# Patient Record
Sex: Female | Born: 1965
Health system: Southern US, Community
[De-identification: ages and names within clinical notes are randomized; demographics above are authoritative.]

## PROBLEM LIST (undated history)

## (undated) DIAGNOSIS — E7800 Pure hypercholesterolemia, unspecified: Secondary | ICD-10-CM

## (undated) DIAGNOSIS — G43909 Migraine, unspecified, not intractable, without status migrainosus: Secondary | ICD-10-CM

## (undated) DIAGNOSIS — N879 Dysplasia of cervix uteri, unspecified: Secondary | ICD-10-CM

## (undated) DIAGNOSIS — O43219 Placenta accreta, unspecified trimester: Secondary | ICD-10-CM

## (undated) DIAGNOSIS — R87619 Unspecified abnormal cytological findings in specimens from cervix uteri: Secondary | ICD-10-CM

## (undated) HISTORY — DX: Dysplasia of cervix uteri, unspecified: N87.9

## (undated) HISTORY — DX: Pure hypercholesterolemia, unspecified: E78.00

## (undated) HISTORY — PX: DILATION AND CURETTAGE OF UTERUS: SHX78

## (undated) HISTORY — DX: Placenta accreta, unspecified trimester: O43.219

## (undated) HISTORY — DX: Migraine, unspecified, not intractable, without status migrainosus: G43.909

## (undated) HISTORY — DX: Unspecified abnormal cytological findings in specimens from cervix uteri: R87.619

---

## 1994-09-14 DIAGNOSIS — R87619 Unspecified abnormal cytological findings in specimens from cervix uteri: Secondary | ICD-10-CM

## 1994-09-14 DIAGNOSIS — N879 Dysplasia of cervix uteri, unspecified: Secondary | ICD-10-CM

## 1994-09-14 HISTORY — DX: Unspecified abnormal cytological findings in specimens from cervix uteri: R87.619

## 1994-09-14 HISTORY — DX: Dysplasia of cervix uteri, unspecified: N87.9

## 1994-09-14 HISTORY — PX: CRYOTHERAPY: SHX1416

## 1998-03-25 ENCOUNTER — Other Ambulatory Visit: Admission: RE | Admit: 1998-03-25 | Discharge: 1998-03-25 | Payer: Self-pay | Admitting: Gynecology

## 1999-07-03 ENCOUNTER — Other Ambulatory Visit: Admission: RE | Admit: 1999-07-03 | Discharge: 1999-07-03 | Payer: Self-pay | Admitting: Gynecology

## 2000-06-10 ENCOUNTER — Encounter (INDEPENDENT_AMBULATORY_CARE_PROVIDER_SITE_OTHER): Payer: Self-pay | Admitting: Specialist

## 2000-06-10 ENCOUNTER — Ambulatory Visit (HOSPITAL_COMMUNITY): Admission: RE | Admit: 2000-06-10 | Discharge: 2000-06-10 | Payer: Self-pay | Admitting: Obstetrics and Gynecology

## 2000-06-25 ENCOUNTER — Other Ambulatory Visit: Admission: RE | Admit: 2000-06-25 | Discharge: 2000-06-25 | Payer: Self-pay | Admitting: Obstetrics and Gynecology

## 2000-12-03 ENCOUNTER — Encounter (INDEPENDENT_AMBULATORY_CARE_PROVIDER_SITE_OTHER): Payer: Self-pay | Admitting: *Deleted

## 2000-12-03 ENCOUNTER — Ambulatory Visit (HOSPITAL_COMMUNITY): Admission: RE | Admit: 2000-12-03 | Discharge: 2000-12-03 | Payer: Self-pay | Admitting: Obstetrics and Gynecology

## 2001-07-08 ENCOUNTER — Ambulatory Visit (HOSPITAL_COMMUNITY): Admission: RE | Admit: 2001-07-08 | Discharge: 2001-07-08 | Payer: Self-pay | Admitting: Obstetrics and Gynecology

## 2001-07-08 ENCOUNTER — Encounter: Payer: Self-pay | Admitting: Obstetrics and Gynecology

## 2001-07-20 ENCOUNTER — Other Ambulatory Visit: Admission: RE | Admit: 2001-07-20 | Discharge: 2001-07-20 | Payer: Self-pay | Admitting: Obstetrics and Gynecology

## 2002-02-22 ENCOUNTER — Encounter: Payer: Self-pay | Admitting: Obstetrics & Gynecology

## 2002-02-22 ENCOUNTER — Ambulatory Visit (HOSPITAL_COMMUNITY): Admission: RE | Admit: 2002-02-22 | Discharge: 2002-02-22 | Payer: Self-pay | Admitting: Obstetrics & Gynecology

## 2002-02-28 ENCOUNTER — Inpatient Hospital Stay (HOSPITAL_COMMUNITY): Admission: AD | Admit: 2002-02-28 | Discharge: 2002-03-06 | Payer: Self-pay | Admitting: Obstetrics and Gynecology

## 2002-08-24 ENCOUNTER — Other Ambulatory Visit: Admission: RE | Admit: 2002-08-24 | Discharge: 2002-08-24 | Payer: Self-pay | Admitting: Obstetrics and Gynecology

## 2003-05-18 ENCOUNTER — Ambulatory Visit (HOSPITAL_COMMUNITY): Admission: RE | Admit: 2003-05-18 | Discharge: 2003-05-18 | Payer: Self-pay | Admitting: Obstetrics and Gynecology

## 2003-05-18 ENCOUNTER — Encounter: Payer: Self-pay | Admitting: Obstetrics and Gynecology

## 2003-05-25 ENCOUNTER — Encounter: Payer: Self-pay | Admitting: Obstetrics and Gynecology

## 2003-05-25 ENCOUNTER — Ambulatory Visit (HOSPITAL_COMMUNITY): Admission: RE | Admit: 2003-05-25 | Discharge: 2003-05-25 | Payer: Self-pay | Admitting: Obstetrics and Gynecology

## 2003-08-23 ENCOUNTER — Ambulatory Visit (HOSPITAL_COMMUNITY): Admission: RE | Admit: 2003-08-23 | Discharge: 2003-08-23 | Payer: Self-pay | Admitting: Obstetrics and Gynecology

## 2003-09-20 ENCOUNTER — Other Ambulatory Visit: Admission: RE | Admit: 2003-09-20 | Discharge: 2003-09-20 | Payer: Self-pay | Admitting: Obstetrics and Gynecology

## 2003-12-26 ENCOUNTER — Ambulatory Visit (HOSPITAL_COMMUNITY): Admission: RE | Admit: 2003-12-26 | Discharge: 2003-12-26 | Payer: Self-pay | Admitting: Obstetrics and Gynecology

## 2004-01-08 ENCOUNTER — Inpatient Hospital Stay (HOSPITAL_COMMUNITY): Admission: RE | Admit: 2004-01-08 | Discharge: 2004-01-11 | Payer: Self-pay | Admitting: Obstetrics and Gynecology

## 2004-01-12 ENCOUNTER — Encounter: Admission: RE | Admit: 2004-01-12 | Discharge: 2004-02-11 | Payer: Self-pay | Admitting: Obstetrics and Gynecology

## 2004-03-13 ENCOUNTER — Encounter: Admission: RE | Admit: 2004-03-13 | Discharge: 2004-04-12 | Payer: Self-pay | Admitting: Obstetrics and Gynecology

## 2004-05-13 ENCOUNTER — Encounter: Admission: RE | Admit: 2004-05-13 | Discharge: 2004-06-12 | Payer: Self-pay | Admitting: Obstetrics and Gynecology

## 2004-06-13 ENCOUNTER — Encounter: Admission: RE | Admit: 2004-06-13 | Discharge: 2004-07-13 | Payer: Self-pay | Admitting: Obstetrics and Gynecology

## 2004-11-20 ENCOUNTER — Other Ambulatory Visit: Admission: RE | Admit: 2004-11-20 | Discharge: 2004-11-20 | Payer: Self-pay | Admitting: Obstetrics and Gynecology

## 2005-12-17 ENCOUNTER — Inpatient Hospital Stay (HOSPITAL_COMMUNITY): Admission: AD | Admit: 2005-12-17 | Discharge: 2005-12-24 | Payer: Self-pay | Admitting: Obstetrics and Gynecology

## 2005-12-17 ENCOUNTER — Ambulatory Visit: Payer: Self-pay | Admitting: Gynecology

## 2005-12-17 ENCOUNTER — Inpatient Hospital Stay (HOSPITAL_COMMUNITY): Admission: AD | Admit: 2005-12-17 | Discharge: 2005-12-17 | Payer: Self-pay | Admitting: Obstetrics and Gynecology

## 2005-12-18 ENCOUNTER — Ambulatory Visit: Payer: Self-pay | Admitting: Neonatology

## 2005-12-21 ENCOUNTER — Encounter (INDEPENDENT_AMBULATORY_CARE_PROVIDER_SITE_OTHER): Payer: Self-pay | Admitting: *Deleted

## 2005-12-25 ENCOUNTER — Encounter: Admission: RE | Admit: 2005-12-25 | Discharge: 2006-01-23 | Payer: Self-pay | Admitting: Obstetrics and Gynecology

## 2006-01-24 ENCOUNTER — Encounter: Admission: RE | Admit: 2006-01-24 | Discharge: 2006-02-23 | Payer: Self-pay | Admitting: Obstetrics and Gynecology

## 2006-02-24 ENCOUNTER — Encounter: Admission: RE | Admit: 2006-02-24 | Discharge: 2006-03-25 | Payer: Self-pay | Admitting: Obstetrics and Gynecology

## 2006-03-26 ENCOUNTER — Encounter: Admission: RE | Admit: 2006-03-26 | Discharge: 2006-04-25 | Payer: Self-pay | Admitting: Obstetrics and Gynecology

## 2006-04-26 ENCOUNTER — Encounter: Admission: RE | Admit: 2006-04-26 | Discharge: 2006-05-26 | Payer: Self-pay | Admitting: Obstetrics and Gynecology

## 2006-05-27 ENCOUNTER — Encounter: Admission: RE | Admit: 2006-05-27 | Discharge: 2006-06-25 | Payer: Self-pay | Admitting: Obstetrics and Gynecology

## 2006-06-26 ENCOUNTER — Encounter: Admission: RE | Admit: 2006-06-26 | Discharge: 2006-07-26 | Payer: Self-pay | Admitting: Obstetrics and Gynecology

## 2006-07-27 ENCOUNTER — Encounter: Admission: RE | Admit: 2006-07-27 | Discharge: 2006-08-25 | Payer: Self-pay | Admitting: Obstetrics and Gynecology

## 2006-08-26 ENCOUNTER — Encounter: Admission: RE | Admit: 2006-08-26 | Discharge: 2006-09-25 | Payer: Self-pay | Admitting: Obstetrics and Gynecology

## 2006-09-26 ENCOUNTER — Encounter: Admission: RE | Admit: 2006-09-26 | Discharge: 2006-10-26 | Payer: Self-pay | Admitting: Obstetrics and Gynecology

## 2006-10-27 ENCOUNTER — Encounter: Admission: RE | Admit: 2006-10-27 | Discharge: 2006-11-24 | Payer: Self-pay | Admitting: Obstetrics and Gynecology

## 2006-11-25 ENCOUNTER — Encounter: Admission: RE | Admit: 2006-11-25 | Discharge: 2006-12-15 | Payer: Self-pay | Admitting: Obstetrics and Gynecology

## 2008-07-31 ENCOUNTER — Encounter: Admission: RE | Admit: 2008-07-31 | Discharge: 2008-07-31 | Payer: Self-pay | Admitting: Obstetrics and Gynecology

## 2009-10-03 ENCOUNTER — Encounter: Admission: RE | Admit: 2009-10-03 | Discharge: 2009-10-03 | Payer: Self-pay | Admitting: Obstetrics and Gynecology

## 2010-11-17 ENCOUNTER — Other Ambulatory Visit: Payer: Self-pay | Admitting: Obstetrics and Gynecology

## 2010-11-17 DIAGNOSIS — Z1231 Encounter for screening mammogram for malignant neoplasm of breast: Secondary | ICD-10-CM

## 2010-12-16 ENCOUNTER — Ambulatory Visit: Payer: Self-pay

## 2010-12-18 ENCOUNTER — Ambulatory Visit: Payer: Self-pay

## 2010-12-31 ENCOUNTER — Ambulatory Visit
Admission: RE | Admit: 2010-12-31 | Discharge: 2010-12-31 | Disposition: A | Discharge status: Home / Self Care | Payer: 59 | Source: Ambulatory Visit | Attending: Obstetrics and Gynecology | Admitting: Obstetrics and Gynecology

## 2010-12-31 DIAGNOSIS — Z1231 Encounter for screening mammogram for malignant neoplasm of breast: Secondary | ICD-10-CM

## 2011-01-30 NOTE — Discharge Summary (Signed)
[name], RANDLEMAN                ACCOUNT NO.:  [id number]   MEDICAL RECORD NO.:  [id number]          PATIENT TYPE:  INP   LOCATION:  9307                          [facility]ILITY:  [facility]   PHYSICIAN:  Carrington Clamp, M.D. DATE OF BIRTH:  November 25, 1965   DATE OF ADMISSION:  12/17/2005  DATE OF DISCHARGE:  12/24/2005                                 DISCHARGE SUMMARY   FINAL DIAGNOSES:  1.  Intrauterine pregnancy at 81 weeks' gestation.  2.  Complete anterior placenta previa with accreta.  3.  History of prior cesarean section x2.  4.  Breech presentation.  5.  Preterm uterine activity.   PROCEDURE:  Classical cesarean section.  Surgeon:  Randye Lobo, M.D.  Assistant:  Ginger Carne, M.D.  Complications:  None.   This 45 year old G5, P 2-0-2-2, was admitted at 32-3/7 weeks' gestation with  vaginal bleeding and a known placenta previa.  The patient had been having  some sporadic vaginal bleeding prior to this and did receive her first dose  of betamethasone on the day of her admission, which was April 5, prior to  this bleeding episode.  The patient's antepartum course at this point had  been also complicated by a history of to cesarean sections.  The patient was  also advanced maternal age but did decline amniocentesis.  The patient was  seen by [facility] secondary to a choroid plexus cyst noted on her  second trimester anatomy scan.  After this consult with [facility]  secondary to a slight dilation of the ventricles in the fetal brain, a  follow-up ultrasound was scheduled in six weeks after that.  I do not have  that report.  The patient also had a history of macrosomia with her two  prior pregnancies but no current diagnosis of gestational diabetes.  The  patient upon admission also was having some preterm uterine activity, which  was initially treated with terbutaline.  The patient was admitted at this  time for bedrest, betamethasone in observation of  bleeding.   The patient did have an ultrasound performed during her hospital course,  which showed normal AFI and a fetal weight of 2366 g a and breech  presentation.  The patient did need to be started on magnesium sulfate  secondary to some preterm uterine contractions.  The patient then started to  develop increased vaginal bleeding and a discussion was made with the  patient.  A decision was made to proceed with a classical cesarean section  as well as the possibility of a transfusion or hysterectomy being needed.  At this point the patient was taken to the operating room on December 21, 2005,  where a classical cesarean section was performed with the delivery of a 5  pound 8.7 ounce female infant with Apgars of 6 and 7.  The baby was in a  double footling breech presentation.  The placenta was noted to be a large  anterior previa with evidence of an accreta in the right anterior lower  uterine segment.  The placenta was manually extracted and was adherent to  this right lower uterine segment.  The uterine cavity appeared clean with no  remaining products of conception for closure.  Delivery went without  complications.  The patient's postoperative course was complicated by some  postoperative mild anemia, but no transfusion was needed.  The patient was  felt ready for discharge on postoperative day #3.  She was sent home on a  regular diet, told to decrease activities, told to continue her prenatal  vitamins and her iron supplement daily, was given Percocet one to two every  four hours as needed for pain, told she could use over-the-counter ibuprofen  up to 600 mg every six hours as needed for pain.  She could use Colace as  needed, was to follow up with Dr. Edward Jolly on April 18 for an incision check,  of course to call with any increased bleeding, pain pr and temperatures.   LABS ON DISCHARGE:  The patient had a final hemoglobin of 8.3, white blood  cell count of 11.5 and platelets of  179,000.      Leilani Able, P.A.-C.      Carrington Clamp, M.D.  Electronically Signed    MB/MEDQ  D:  01/19/2006  T:  01/20/2006  Job:  [id number]

## 2011-01-30 NOTE — Op Note (Signed)
[facility]  Patient:    ALESANA, MAGISTRO                       MRN: [id number] Proc. Date: 12/03/00 Adm. Date:  [id number] Disc. Date: [id number] Attending:  Conley Simmonds A                           Operative Report  PREOPERATIVE DIAGNOSIS:       Intrauterine pregnancy at 11-5/7 weeks missed abortion.  POSTOPERATIVE DIAGNOSIS:      Intrauterine pregnancy at 11-5/7 weeks missed abortion.  PROCEDURE:                    Dilation and evacuation.  SURGEON:                      Brook A. Edward Jolly, M.D.  ANESTHESIA:                   MAC, paracervical block with 1% lidocaine.  INTRAVENOUS FLUIDS:           1300 of Ringers lactate.  ESTIMATED BLOOD LOSS:         100 cc.  URINE OUTPUT:                 100 cc.  COMPLICATIONS:                None.  INDICATIONS FOR PROCEDURE:    The patient is a 45 year old gravida 2, para 0-0-1-0 Caucasian female at 11-5/[redacted] weeks gestation by last menstrual period and first trimester ultrasound who presented with evidence of a missed abortion on December 01, 2000.  No fetal heart tones were present with the Doptone, and both a transabdominal and transvaginal ultrasound documented the absence of fetal cardiac activity.  The crown rump length was consistent with approximately 9-1/[redacted] weeks gestation.  The patient had not had any cramping or bleeding.  Her blood type was noted to be Rh positive.  The patient was counseled regarding her diagnosis and a plan was made to proceed with a dilation and evacuation procedure after the risks and benefits were reviewed with her.  FINDINGS:                     Examination under anesthesia revealed a 9-10 week sized anteverted mobile uterus.  No adnexal masses were appreciated.  The cervix was noted to be closed and thick.  There was a large volume of products of conception obtained and the uterus sounded to 9.5 cm prior to the D&E procedure.  SPECIMENS:                    Products of  conception.  DESCRIPTION OF PROCEDURE:     With an IV in place, the patient was taken to the operating room after she was properly identified.  She did receive Ancef 1 g intravenously for prophylaxis.  The patient was placed in a dorsal lithotomy position and MAC anesthesia was induced.  The patients vagina and perineum were sterilely prepped and draped and the bladder was catheterized of urine.  Examination under anesthesia was performed.  A speculum was placed in the vagina and a single tooth tenaculum was placed on the anterior cervical lip.  Paracervical block was performed with a total of 10 cc of 1% lidocaine. The uterus was sounded and the cervix was then  serially dilated to a #25 Pratt dilator.  A #9 cannula was then inserted through the cervical os to the level of the uterine fundus and suction was applied.  The catheter was withdrawn from the uterine fundus under an appropriate pressure while turning in a clockwise fashion.  This procedure was repeated until there was no evidence of additional products of conception obtained.  The suction tip catheter was removed and the four quadrants of the uterus were gently curetted with a sharp curet such that they had no further evidence of remaining products of conception.  The suction tip catheter was then reinserted to the level of the uterine fundus and was withdrawn to remove any remaining blood.  The suction tip was removed and there was no evidence of any ongoing bleeding.  Bimanual examination confirmed the uterus to be well contracted.  The patient was taken out of the dorsal lithotomy position and awakened.  She was escorted to the recovery room in stable and awake condition.  There were no complications to the procedure.  All sponge, needle and instrument counts were correct. DD:  12/03/00 TD:  12/05/00 Job: [id number]

## 2011-01-30 NOTE — Op Note (Signed)
[name], [name]                ACCOUNT NO.:  [id number]   MEDICAL RECORD NO.:  [id number]          PATIENT TYPE:  INP   LOCATION:  9374                          [facility]  [facility]   PHYSICIAN:  Randye Lobo, M.D.   DATE OF BIRTH:  03/28/1966   DATE OF PROCEDURE:  12/21/2005  DATE OF DISCHARGE:                                 OPERATIVE REPORT   PREOPERATIVE DIAGNOSIS:  1.  Intrauterine gestation at 33 weeks.  2.  Complete anterior placenta previa with vaginal bleeding.  3.  A history of prior cesarean section x2.  4.  Breech presentation.  5.  Preterm uterine activity.   POSTOPERATIVE DIAGNOSIS:  1.  Intrauterine gestation at 33 weeks.  2.  Complete, anterior placenta previa with accreta.  3.  History of prior cesarean section x2.  4.  Footling breech.  5.  Preterm uterine activity.   PROCEDURE:  Classical cesarean section.   SURGEON:  Conley Simmonds, MD   ASSISTANT:  Alfonzo Feller, MD   ANESTHESIA:  Spinal.   IV FLUIDS:  4000 mL Ringer's lactate.   ESTIMATED BLOOD LOSS:  1500 mL.   URINE OUTPUT:  500 mL.   COMPLICATIONS:  None.   INDICATIONS FOR PROCEDURE:  The patient is a 45 year old gravida 5, para 2-0-  2-2 Caucasian female who was admitted at 62 + 3 weeks' gestation with  vaginal bleeding and a history of a known placenta previa.  The patient had  been having some sporadic vaginal bleeding following this, and she did  receive her first dose of betamethasone on the day of her admission which  was December 17, 2005 prior the bleeding episode.   The patient's antepartum course was also significant for a history of two  prior cesarean sections.  The patient had an advanced maternal age status  and she declined an amniocentesis.  On the patient's second trimester  anatomy screening, the fetus was noted to have dangling choroid plexus cyst  and she was followed by [facility] regarding this during her  pregnancy.  The lateral cerebral ventricles were noted to  be the upper  limits of normal.  The patient had a history of macrosomia with her two  prior pregnancies and no previous and no current diagnosis of gestational  diabetes.   When the patient was admitted, she had obvious signs of vaginal bleeding  with some preterm uterine activity which was initially treated with  terbutaline.  The fetal heart rate tracing was reactive and reassuring.  The  patient was admitted for bedrest, betamethasone, and observation of  bleeding.   The patient had an ultrasound documenting an estimated fetal weight of 2366  grams.  The presentation was noted to be breech.  The amniotic fluid index  was normal.   The patient did develop some increase preterm uterine activity which was  treated with magnesium sulfate.  The patient developed increasing vaginal  bleeding and a plan was made to proceed with a classical cesarean section  after risks, benefits, and alternatives were discussed with the patient.  The possibility of transfusion as  well as hysterectomy were both discussed  with the patient prior to her surgery.  The patient chose to proceed with  the delivery.   FINDINGS:  A viable female was delivered at 12:43 a.m.Marland Kitchen  Apgars were 6 at  one minute and 7 at five minutes.  The weight was later noted to be 5 pounds  8 ounces.  Cord pH was 7.26.  The presentation was double footling breech.  The placenta was noted to be a large anterior previa with evidence of an  accreta and the right anterior lower uterine segment.  The tubes and ovaries  were unremarkable.   SPECIMENS:  None.   PROCEDURE:  The patient was taken from her room on the antenatal unit down  to the operating room.  The fetus was monitored while the spinal anesthetic  was administered.  The patient was then placed in the supine position with a  left lateral tilt and her abdomen was sterilely prepped and draped.  Her  Foley catheter had been previously placed and the patient had received a   dose of Ancef 1 gram IV as she had been exhibiting some symptoms dysuria and  possible urinary tract infection approximately 1 hour earlier.   A Pfannenstiel incision was created sharply with the scalpel.  This was  carried down through the subcutaneous layer using monopolar cautery and the  scalpel.  The fascia was then incised in the midline and the incision was  extended bilaterally with Mayo scissors.  The rectus muscles were dissected  off of the overlying fascia both superiorly and inferiorly.  The rectus  muscles were then sharply divided in the midline.  Parietal peritoneum was  elevated with two hemostat clamps and entered sharply.  The peritoneal  incision was extended cranially and caudally.   The bladder flap was noted to extend over the lower uterine segment and a  bladder flap was sharply created.  Due to bleeding along the anterior lower  uterine segment, the bladder could not be retracted down far onto the lower  uterine segment.   The uterine fundus was exposed and a vertical incision was created in the  uterine fundus using a scalpel.  The incision was carried down to the  membranes.  The placenta was encountered which extended essentially along  the whole anterior lower uterine segment as well as a good majority of the  fundus.  The placenta needed to be traversed and order to deliver the fetus  which was in the double footling breech presentation.  Each of the feet were  grasped and the lower extremities were delivered followed by the buttocks.  The trunk was delivered and the hands were rotated across the chest and the  vertex was delivered last.  The nares and mouth were suctioned and the cord  was doubly clamped and cut the newborn was carried over to the  pediatricians.  She was noted to have a spontaneous cry.   A cord pH was obtained.  Cord blood could not be obtained.  The placenta was manually extracted.  It was very adherent to the right anterior lower   uterine segment.  The uterus was exteriorized at this time for visualization  and for closure.  The uterine cavity was wiped clean and there did not  appear to be any remaining products of conception and either the fundal  region, lower uterine segment or cervix.  The right anterior lower uterine  segment was noted to be quite thin and an  accreta was suspected.  Due to  bleeding in this region a running locked suture of 0 Vicryl was used to  create hemostasis along this area of the endometrium and myometrium.  This  did create good hemostasis.  The uterine incision was then closed with a  double layer closure of #1 chromic.  The first was a running locked layer  and the second was an imbricating layer.  In the area of the accreta of the  right lower uterine segment, there was some additional bleeding near the  incision and this became hemostatic with a series of figure-of-eight sutures  of both 0-0 Vicryl and 2-0 Vicryl.  The area of the defect was reinforced by  bringing myometrium together from both sides essentially to close over the  area of the defect again using 0 Vicryl figure-of-eight sutures.  Bleeding  along the uterine serosa at the superior portion of the incision became  hemostatic with figure-of-eight sutures of 2-0 Vicryl.   The uterus was then returned to the peritoneal cavity which was irrigated  and suctioned.  Hemostasis was noted to be good at this time.  The abdomen  was therefore closed.  The peritoneum was closed with a running suture of 2-  0 Vicryl.  The rectus muscles were reapproximated in the midline with  interrupted sutures of #1 chromic.  The fascia was closed with a running  suture of zero Vicryl.  The subcutaneous tissue was irrigated and suctioned  and made hemostatic with monopolar cautery.  The skin was closed with  staples and a sterile pressure dressing was placed over this.   This concluded the patient's procedure.  There were no complications.  All   needle, instrument, and lap counts were correct.      Randye Lobo, M.D.  Electronically Signed     BES/MEDQ  D:  12/21/2005  T:  12/21/2005  Job:  [id number]

## 2011-01-30 NOTE — Discharge Summary (Signed)
   NAME:  [name], [name]                          ACCOUNT NO.:  [id number]   MEDICAL RECORD NO.:  [id number]                   PATIENT TYPE:  NP   LOCATION:  9110                                 [facility]ILITY[facility]   PHYSICIAN:  Randye Lobo, M.D.                DATE OF BIRTH:  07-20-1966   DATE OF ADMISSION:  02/28/2002  DATE OF DISCHARGE:  03/06/2002                                 DISCHARGE SUMMARY   FINAL DIAGNOSES:  Intrauterine pregnancy at term and fetal macrosomia.   PROCEDURE:  Primary low transverse cesarean section.   SURGEON:  Randye Lobo, M.D.   ASSISTANT:  Luvenia Redden, M.D.   COMPLICATIONS:  None.   HISTORY OF PRESENT ILLNESS:  This 45 year old G3, P0-0-2-0 presents at term  for a cesarean section.  The patient was noted to have fetal macrosomia on  ultrasound done in the office and risks and benefits were discussed with the  patient and she wished to proceed with cesarean section.  She was taken to  the operating room on February 28, 2002 by Dr. Conley Simmonds where a primary low  transverse cesarean section is performed with the delivery of a 12 pound 8  ounce female infant with Apgars of 8 and 9.  There were two fibroids noted.  The procedure went without complications and patient's postoperative course  was benign without significant fevers.  The patient had some postoperative  anemia with a hemoglobin of 9.2 and was started on iron 325 mg one t.i.d.  and was felt ready for discharge on postoperative day number four.  Was sent  home on a regular diet.  Told to decrease activity.  Told to continue her  iron t.i.d.  Told to increase her fluid intake.  Was given Percocet one to  two q.4h. as needed for pain and told to follow up in the office in four  weeks.     Michelle B. Levora Dredge, PA-C                Randye Lobo, M.D.    MBB/MEDQ  D:  05/02/2002  T:  05/02/2002  Job:  [id number]

## 2011-01-30 NOTE — Discharge Summary (Signed)
NAME:  [name], [name]                          ACCOUNT NO.:  [id number]   MEDICAL RECORD NO.:  [id number]                   PATIENT TYPE:  INP   LOCATION:  9106                                 [facility]   PHYSICIAN:  Miguel Aschoff, M.D.                    DATE OF BIRTH:  April 16, 1966   DATE OF ADMISSION:  01/08/2004  DATE OF DISCHARGE:  01/11/2004                                 DISCHARGE SUMMARY   FINAL DIAGNOSES:  1. Intrauterine pregnancy at term.  2. History of prior cesarean section secondary to fetal macrosomia.  The     patient desires repeat cesarean section.  3. Macrosomia.   PROCEDURE:  Repeat low transverse cesarean section.   SURGEON:  Dr. Conley Simmonds.   ASSISTANT:  Dr. Lodema Hong.   COMPLICATIONS:  None.   This 45 year old G5 P1-0-2-1 presents at 66 and two-sevenths weeks gestation  for a repeat cesarean section secondary to macrosomia.  The patient had had  a previous cesarean section performed with her first pregnancy and had  delivery of a 12-pound 8-ounce female.  This pregnancy we also suspected  macrosomia and discussion was held with the patient, and the decision was  made to proceed with a cesarean section.  The patient's [place] forms are  not in the chart but I know she had no diagnosed gestational diabetes.  She  was nonimmune to rubella but did receive vaccination before discharge.  The  patient was taken to the operating room on January 08, 2004 by Dr. Conley Simmonds  where a repeat low transverse cesarean section was performed with the  delivery of a 10-pound 12-ounce female infant with Apgars of 8 and 9.  Delivery went without complications.  The patient's postoperative course was  benign with no significant fevers.  The patient was felt ready for discharge  on postoperative day #3.  She was sent home on a regular diet, told to  decrease activities, told to continue prenatal vitamins, was given Percocet  one to two q.4h. as needed for pain, told she could  use Motrin 600 mg one  q.6h. as needed for pain, was to follow up in the office in 4 weeks.   LABORATORY DATA ON DISCHARGE:  The patient had a hemoglobin of 11.4, white  blood cell count of 11.3.     Leilani Able, P.A.-C.                Miguel Aschoff, M.D.    MB/MEDQ  D:  01/28/2004  T:  01/28/2004  Job:  [id number]

## 2011-01-30 NOTE — Op Note (Signed)
[facility]  Patient:    [name], [name]                         MRN: [id number] Proc. Date: 06/10/00 Adm. Date:  [id number] Attending:  Conley Simmonds A                           Operative Report  PREOPERATIVE DIAGNOSIS:       Missed abortion.  POSTOPERATIVE DIAGNOSIS:      Missed abortion.  OPERATION:                    Examination under anesthesia, dilatation and                               evacuation.  SURGEON:                      Brook A. Edward Jolly, M.D.  ASSISTANT:  ANESTHESIA:                   MAC anesthesia, paracervical block with 1%                               lidocaine.  IV FLUIDS:                    900 cc lactated Ringers.  ESTIMATED BLOOD LOSS:         50 cc.  URINE OUTPUT:                 100 cc prior to procedure.  COMPLICATIONS:                None.  INDICATIONS:                  The patient was a 45 year old gravida 1, para 0 with a last menstrual period April 07, 2000, who initially presented to the office at 5+[redacted] weeks gestation at which time she had an ultrasound for right-sided pain which documented an intrauterine pregnancy with a gestational sac.  No fetal pole was noted at this time.  The patient did have serial Beta HCGs drawn.  On May 28, 2000, the quantitative Beta HCG was 33,614.6 and on May 30, 2000, the quantitative Beta HCG was 30,442.0.  The patient did have a repeat ultrasound on May 31, 2000, which documented a crown-rump length of 4 mm consistent with 6+[redacted] weeks gestation.  Fetal heart tones were noted to be present but at a rate of 54 beats per minute.  There was evidence of a very small subchorionic hemorrhage.  The patient had a follow-up ultrasound one week later because of the slow fetal heart rate, and at this time a missed abortion was diagnosed as there was no evidence of fetal heart tones present.  Alternatives for care were discussed with the patient along with risks and benefits of  each, and the patient chose to proceed with a dilatation and evacuation.  FINDINGS:                     Examination under anesthesia revealed a six to seven-week size anteverted mobile uterus.  No adnexal masses were appreciated. A moderate amount of products of conception were sent to pathology.  SPECIMENS:  Products of conception were sent to pathology.  DESCRIPTION OF PROCEDURE:     With an IV in place, the patient was escorted to the operating room suite after she was properly identified.  The patient received MAC anesthesia, and she was placed in the dorsal lithotomy position. The vagina and perineum were sterilely prepped and draped, and an examination under anesthesia was performed.  A speculum was placed inside the vagina, and a single-tooth tenaculum was placed on the anterior cervical loop.  A paracervical block was performed with a total of 10 cc of 1% lidocaine by injecting at the 5 and 7 oclock positions along the cervix.  The uterus was sounded to 10 cm, and the cervix was then sequentially dilated to a #25 Pratt dilator. A #7 cannula was inserted to the level of the uterine fundus, and suction was applied. The cannula was then withdrawn from the uterine cavity by rotating in a clockwise fashion.  This was performed three times.  A sharp curette was gently passed in all four quadrants to assure that no remaining products of conception remained.  The cannula was then reinserted to remove any remaining blood from within the uterine cavity.  At the end of the procedure, hemostasis was noted to be excellent from the tenaculum site and coming from within the cervix.  The patient was therefore taken out of the dorsal lithotomy position after the instruments were removed from the vagina. She was escorted to the recovery room in stable and awake condition. There were no complications to the procedure.  All sponge, needle and instrument counts were correct. DD:   06/10/00 TD:  06/10/00 Job: [id number]

## 2011-01-30 NOTE — H&P (Signed)
NAME:  [name], [name]                          ACCOUNT NO.:  [id number]   MEDICAL RECORD NO.:  [id number]                   PATIENT TYPE:  INP   LOCATION:  NA                                   FACILITY:  [facility]   PHYSICIAN:  Randye Lobo, M.D.                DATE OF BIRTH:  01/17/1966   DATE OF ADMISSION:  DATE OF DISCHARGE:                                HISTORY & PHYSICAL   PLEASE NOTE:  This preoperative history and physical exam is from January 02, 2004.   ANTICIPATED DATE OF SURGERY:  The patient is scheduled for surgery on January 08, 2004 at the [facility] at 7:30.   CHIEF COMPLAINT:  1. History of prior C-section.  2. Suspected fetal macrosomia.   HISTORY OF PRESENT ILLNESS:  The patient is a 45 year old gravida 4, para 1,  0, 2, 1 Caucasian female at 38-2/7ths weeks gestation on January 08, 2003 by  last menstrual period and by 6-4/7ths weeks ultrasound who has a history of  a prior cesarean section in 2003 for fetal macrosomia with delivery of a 12-  pound 8-ounce female who, again, during this pregnancy demonstrates suspected  fetal macrosomia.  The patient had an ultrasound performed on December 26, 2003  documenting a fetus with an estimated fetal weight of 4,823 grams, which is  greater than the 95th percentile.  The amniotic fluid index was noted to be  normal.  The patient has an anterior placenta with no evidence of previa.  Also in the patient's previous pregnancy and in her current pregnancy she  has had no evidence of gestational diabetes.  In this pregnancy the patient  was tested at 23-2/7ths weeks and had a one-hour glucose tolerance test of  107.  Her one-hour glucose tolerance test at 29-2/7ths weeks was documented  at 129.  The patient's urine dips in the office demonstrated trace sugar at  the time of her glucose tolerance test at 23-2/7ths weeks and it  demonstrated 1+ glucose when she was at 38-3/7ths weeks; and, this was  attributed to her food intake  prior to her office visit.  Other than this  she has demonstrated no glucosuria.   The patient's antepartum course has also be significant for:  1. AMA status; the patient declined amniocentesis and quad screening.  2. History of recurrent spontaneous abortions.  The patient has been treated     with empiric progesterone suppositories in this pregnancy during the     first trimester.  3. Migraine headaches.  4. Rubella nonimmune status.  5. Group B Strep negative.  6. Urinary tract infections during pregnancy.   PAST GYNECOLOGICAL HISTORY:  The patient's gynecologic history if  significant for cryotherapy of the cervix in 1996.  The patient's last Pap  smear was performed January 16. 2005 and was within normal limits.  The  patient did have a bilateral diagnostic mammogram  and a right breast  ultrasound, which were performed in April 2004 when she was noted to have  some thickening of the right breast.  The mammogram and the ultrasound were  normal.  The patient had a follow up breast exam performed in August 2004,  which was without dominant masses, skin retractions, axillary adenopathy or  nipple discharge.   PAST MEDICAL HISTORY:  History of migraine headaches.  The patient has been  off Topamax during pregnancy.   PAST SURGICAL HISTORY:  1. Status post missed abortion times two with dilation and evacuation     procedures for each in the years 2001 and 2002.  2. Status post primary cesarean section in 2003.   MEDICATIONS:  Prenatal vitamins.   ALLERGIES:  No known drug allergies.   SOCIAL HISTORY:  The patient is married.  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  The patient is currently at home full-time taking care of her  son.  The patient denies the use of tobacco, alcohol or illicit drugs.   FAMILY HISTORY:  The patient's mother has chronic hypertension.   PHYSICAL EXAMINATION:  VITAL SIGNS:  Blood pressure is 120/78.  The  patient's weight is 209 pounds.  Height  is 5 feet 8 inches.  HEENT:  Normocephalic and atraumatic.  LUNGS:  Lungs are clear to auscultation bilaterally.  HEART:  S1 and S2 with a regular rate and rhythm.  ABDOMEN:  The abdomen is gravid and nontender. The fundal height is measured  at 46 cm.  VAGINAL EXAMINATION:  Physical exam demonstrates the cervix to be closed,  50% dilated, vertex and with a -3 station.  EXTREMITIES:  Lower extremities; 1+ edema of the bilateral lower  extremities.   IMPRESSION:  The patient is a 45 year old gravida 4, para 1, 0, 2, 1 female  with a history of a prior cesarean section for fetal macrosomia and a  current pregnancy demonstrating suspected macrosomia as well.   PLAN:  The patient will undergo a repeat low-segment transverse cesarean  section on January 08, 2004 at the [facility].  Risks,  benefits and alternatives have been discussed with the patient who wishes to  proceed.                                               Randye Lobo, M.D.    BES/MEDQ  D:  01/07/2004  T:  01/08/2004  Job:  [id number]

## 2011-01-30 NOTE — Op Note (Signed)
NAME:  [name], [name]                          ACCOUNT NO.:  [id number]   MEDICAL RECORD NO.:  [id number]                   PATIENT TYPE:  INP   LOCATION:  9106                                 [facility]ILITY:  [facility]   PHYSICIAN:  Randye Lobo, M.D.                DATE OF BIRTH:  Jun 23, 1966   DATE OF PROCEDURE:  01/08/2004  DATE OF DISCHARGE:                                 OPERATIVE REPORT   PREOPERATIVE DIAGNOSES:  1. History of  prior cesarean section for fetal macrosomia.  2. Suspected macrosomia.   POSTOPERATIVE DIAGNOSES:  1. History of prior cesarean section for macrosomia.  2. Suspected macrosomia.   PROCEDURE:  Repeat low segment transverse cesarean section.   SURGEON:  Randye Lobo, M.D.   ASSISTANT:  Luvenia Redden, M.D.   ANESTHESIA:  Spinal.   FLUIDS REPLACED:  500 mL Ringer's lactate.   ESTIMATED BLOOD LOSS:  1200 mL.   URINE OUTPUT:  200 mL of clear yellow urine.   COMPLICATIONS:  None.   INDICATION FOR PROCEDURE:  The patient is a 45 year old gravida 5, para 1-0-  2-1, Caucasian female at 101 +2 weeks' gestation by last menstrual period and  a 6 + 4 week ultrasound, who has a history of a prior cesarean section for  fetal macrosomia with delivery of a 12 pound 8 ounce female, who in this  pregnancy also demonstrated suspected fetal macrosomia.  An ultrasound  documented an estimated fetal weight of 4823 g on December 26, 2003.  The  amniotic fluid index was normal.  The patient had no history of gestational  diabetes in her prior pregnancy or in her current pregnancy, and she was  tested twice during the current pregnancy.  The patient's cervix was noted  to be closed and 50% effaced at her office visit last week.  The plan was  made to proceed with a repeat low segment transverse cesarean section, and  the patient agreed to proceed after risks, benefits, and alternatives were  discussed with her.   FINDINGS:  A viable female was delivered at 7:51 a.m.  Apgars 8 at  one minute.  The weight 10 pounds 12 ounces.  Clear amniotic fluid was noted. Normal  placenta with three-vessel cord.  Normal uterus, tubes, and ovaries.  The  bladder was slightly adherent to the lower uterine segment.   SPECIMENS:  None.   PROCEDURE:  The patient was re-identified in the preoperative hold area.  The patient was taken to the operating room, where she received a spinal  anesthetic.  The patient was placed in a supine position with a left lateral  tilt and the abdomen was sterilely prepped and a Foley catheter placed  inside the bladder.  She was then sterilely draped.   A Pfannenstiel incision was created along the line of the patient's previous  Pfannenstiel incision.  This was carried down to the fascia  with a  combination of monopolar cautery and sharp dissection.  The fascia was then  incised in the midline with a scalpel and the incision was carried out  bilaterally with a Mayo scissors.  The rectus muscles were dissected off of  the overlying fascia using the Mayo scissors both inferiorly and superiorly.  The rectus muscles were then sharply divided.  The parietal peritoneum was  elevated with two hemostat clamps and was entered sharply.  The incision was  extended cranially and caudally.  The bladder was noted to be slightly drawn  up onto the lower uterine segment with one small area of a dense adhesion  measuring approximately 1 cm in diameter.  The rest were filmy adhesions.  These adhesions were taken down without difficulty.   The bladder retractor exposed the lower uterine segment and then the bladder  flap was sharply created.  A lower uterine segment transverse incision was  created sharply with the scalpel.  Ultimately the uterine cavity was entered  bluntly.  Membranes were ruptured and clear fluid was noted, and a hand was  inserted through the uterine incision.  The fetal vertex was noted to be the  presenting part but was high in the pelvis.  The  vertex was therefore  elevated slightly and a Mityvac vacuum extractor was then placed over the  fetal vertex.  With one effort, the head was delivered without difficulty.  The nares and mouth were suctioned and then the remainder of the newborn was  again delivered without difficulty.  The cord was doubly clamped and cut and  the newborn was carried over to the awaiting pediatricians in good  condition.   The patient did receive Ancef 1 g intravenously at this time.  Cord blood  was obtained and the placenta was manually extracted and sent to labor and  delivery.  The uterus was exteriorized for its closure.  A lap pad was used  to wipe any remaining products of conception from within the uterine cavity,  and there were none.  The uterus was closed in a double-layer closure of #1  chromic.  The first was a running locked layer and the second was an  imbricating layer.  A figure-of-eight suture was placed through the serosa  of the upper portion of the lower uterine segment, where an adhesion was  attached.  This created hemostasis in this area as well.  The pelvis was  irrigated and suctioned of any blood and fluid, and the uterus was returned  to the peritoneal cavity.  There was some bleeding noted along the  peritoneal edge of the right apex, and this responded to monopolar cautery.  Hemostasis was then excellent.   The abdomen was then closed at this time.  A running suture of 3-0 Vicryl  was used to close the parietal peritoneum.  The rectus muscles were  reapproximated in the midline using figure-of-eight sutures of #1 chromic.  The fascia was closed with a running suture of 0 Vicryl.  The subcutaneous  tissue was examined and found to be hemostatic.  The skin was closed with  staples and a sterile bandage was placed over this.   There were no complications to the procedure.  All needle, instrument, and sponge counts were correct.  The patient was escorted to the recovery room   in stable condition.  Randye Lobo, M.D.    BES/MEDQ  D:  01/08/2004  T:  01/08/2004  Job:  [id number]

## 2011-01-30 NOTE — Op Note (Signed)
[facility]  Patient:    [name], [name] Visit Number: [id number] MRN: [id number]          Service Type: OBS Location: [facility] Attending Physician:  Melony Overly Dictated by:   Devoria Albe Edward Jolly, M.D. Proc. Date: 02/28/02                             Operative Report  PREOPERATIVE DIAGNOSIS:       Intrauterine gestation at 39+4 weeks.  Fetal macrosomia.  POSTOPERATIVE DIAGNOSIS:      Intrauterine gestation at 39+4 weeks.  Fetal macrosomia.  OPERATION:                    Primary low transverse cesarean section.  SURGEON:                      Brook A. Edward Jolly, M.D.  ASSISTANT:                    Luvenia Redden, M.D.  ANESTHESIA:                   Spinal.  IV FLUIDS:                    4000 cc Ringers lactate.  ESTIMATED BLOOD LOSS:         1100 cc.  URINE OUTPUT:                 400 cc.  COMPLICATIONS:                None.  INDICATIONS:                  The patient was a 45 year old, gravida 3, para 0-0-2-0, Caucasian female at 39+[redacted] weeks gestation, who was diagnosed with fetal macrosomia on an ultrasound at [facility] on February 22, 2002, which was ordered for fundal height measurements of size greater than dates.  The estimated fetal weight at that time was noted to be 4860 grams which was greater than the 97th percentile for 39 weeks.  The amniotic fluid index measured 12.1 cm and the placenta was noted to be anterior and without a previa.  The fetal position was noted to be cephalic at that time.  The patient was counseled regarding the fetal macrosomia, and a decision was made to proceed with a primary low transverse cesarean section after the risks, benefits, and alternatives were discussed with her and her husband.  FINDINGS:                     A viable female infant was delivered at 10:11 a.m. with Apgars of 8 at one minute and 9 at five minutes.  The newborn weight was later noted to be 12 pounds 8 ounces.  The newborn  was vigorous at birth.  The amniotic fluid was noted to be clear.  The placenta had a normal configuration and insertion of a three vessel cord.  The bilateral fallopian tubes and ovaries were within normal limits.  There were two uterine fibroids appreciated.  There was a 2.5 cm posterior fundal subserosal fibroid and a 3 cm anterior fundal right intramural fibroid.  DESCRIPTION OF PROCEDURE:     With an IV in place, the patient was taken to the operating room after she was properly identified.  The patient received a  spinal anesthetic and she was then placed in the supine position on the operating table.  The abdomen and the perineum were then sterilely prepped, and a Foley catheter was placed inside the urinary bladder.  The patient was then sterilely draped.  The procedure began with a Pfannenstiel incision created sharply with a scalpel.  This was carried down to the fascia using the scalpel as well.  The fascia was then scored in the midline and the incision was extended bilaterally using the Mayo scissors.  The rectus muscles were dissected from the overlying fascia superiorly and inferiorly and the rectus muscles were divided sharply in the midline using the Mayo scissors.  The parietoperiotneum was then elevated and was entered sharply with the Metzenbaum scissors which was also used to extend the parietoperitoneal incision cranially and caudally. A bladder retractor was then placed over the bladder and the lower uterine segment was exposed.  The bladder flap was created sharply with Metzenbaum scissors.  A transverse uterine incision was then created in the lower uterine segment with the scalpel, and the incision was extended bluntly bilaterally. A hand was inserted through the uterine incision, and the vertex was delivered without difficulty followed by remainder of the infant.  The nares and mouth were then suctioned and the cord was doubly clamped and cut, and the  newborn was carried over to the awaiting pediatricians.  The patient did receive cefotetan 1 gram intravenously and the cord blood was collected.  The placenta was then manually extracted.  All remaining membranes were removed from within the uterine cavity which was wiped clean with a moistened lap pad.  The uterine incision was closed with a running locking suture of #1 chromic.  The uterus which had been exteriorized for its closure was then returned to the peritoneal cavity which was irrigated and suctioned.  Hemostasis was excellent. The abdomen was then closed.  A running suture of 3-0 plain was used on the parietoperitoneum.  The rectus muscles were brought together in the midline with two figure-of-eight sutures of #1 chromic.  The fascia was closed with a running suture of 0 Vicryl and the skin was closed with staples after hemostasis was assured in the subcutaneous layer with monopolar cautery. A sterile bandage was then placed over the incision.  This concluded the procedure which was without complications.  All sponge, needle, and instrument counts were correct. Dictated by:   Devoria Albe Edward Jolly, M.D. Attending Physician:  Melony Overly DD:  02/28/02 TD:  03/01/02 Job: [id number]

## 2012-05-18 ENCOUNTER — Other Ambulatory Visit: Payer: Self-pay | Admitting: Obstetrics and Gynecology

## 2012-05-18 DIAGNOSIS — Z1231 Encounter for screening mammogram for malignant neoplasm of breast: Secondary | ICD-10-CM

## 2012-06-07 ENCOUNTER — Ambulatory Visit
Admission: RE | Admit: 2012-06-07 | Discharge: 2012-06-07 | Disposition: A | Discharge status: Home / Self Care | Payer: 59 | Source: Ambulatory Visit | Attending: Obstetrics and Gynecology | Admitting: Obstetrics and Gynecology

## 2012-06-07 DIAGNOSIS — Z1231 Encounter for screening mammogram for malignant neoplasm of breast: Secondary | ICD-10-CM

## 2012-10-17 LAB — HM PAP SMEAR: HM Pap smear: NEGATIVE

## 2012-12-09 ENCOUNTER — Encounter: Payer: Self-pay | Admitting: Obstetrics and Gynecology

## 2012-12-12 ENCOUNTER — Encounter: Payer: Self-pay | Admitting: Obstetrics and Gynecology

## 2012-12-12 ENCOUNTER — Ambulatory Visit (INDEPENDENT_AMBULATORY_CARE_PROVIDER_SITE_OTHER): Payer: 59 | Admitting: Obstetrics and Gynecology

## 2012-12-12 VITALS — BP 100/64

## 2012-12-12 DIAGNOSIS — N921 Excessive and frequent menstruation with irregular cycle: Secondary | ICD-10-CM

## 2012-12-12 MED ORDER — NORETHINDRONE 0.35 MG PO TABS: 1.0000 | ORAL_TABLET | Freq: Every day | ORAL | Status: DC

## 2012-12-12 NOTE — Patient Instructions (Signed)
Metrorrhagia   Metrorrhagia is uterine bleeding at irregular intervals, especially between menstrual periods.   CAUSES    Dysfunctional uterine bleeding.   Uterine lining growing outside the uterus (endometriosis).   Embryo adhering to uterine wall (implantation).   Pregnancy growing in the fallopian tubes (ectopic pregnancy).   Miscarriage.   Menopause.   Cancer of the reproduction organs.   Certain drugs such as hormonal contraceptives.   Inherited bleeding disorders.   Trauma.   Uterine fibroids.   Sexually transmitted diseases (STDs).   Polycystic ovarian disease.  DIAGNOSIS   A history will be taken.   A physical exam will be performed.   Other tests may include:   Blood tests.   A pregnancy test.   An ultrasound of the abdomen and pelvis.   A biopsy of the uterine lining.   AMRI or CT scan of the abdomen and pelvis.  TREATMENT  Treatment will depend on the cause.  HOME CARE INSTRUCTIONS    Take all medicines as directed by your caregiver. Do not change or switch medicines without talking to your caregiver.   Take all iron supplements exactly as directed by your caregiver. Iron supplements help to replace the iron your body loses from irregular bleeding.If you become constipated, increase the amount of fiber, fruits, and vegetables in your diet.   Do not take aspirin or medicines that contain aspirin for 1 week before your menstrual period or during your menstrual period. Aspirin may increase the bleeding.   Rest as much as possible if you change your sanitary pad or tampon more than once every 2 hours.   Eat well-balanced meals including foods high in iron, such as green leafy vegetables, red meat, liver, eggs, and whole-grain breads and cereals.   Do not try to lose weight until the abnormal bleeding is controlled and your blood iron level is back to normal.  SEEK MEDICAL CARE IF:    You have nausea and vomiting, or you cannot keep foods down.   You feel dizzy or have diarrhea  while taking medicine.   You have any problems that may be related to the medicine you are taking.  SEEK IMMEDIATE MEDICAL CARE IF:    You have a fever.   You develop chills.   You become lightheaded or faint.   You need to change your sanitary pad or tampon more than once an hour.   Your bleeding becomesheavy.   You begin to pass clots or tissue.  MAKE SURE YOU:    Understand these instructions.   Will watch your condition.   Will get help right away if you are not doing well or get worse.  Document Released: 08/31/2005 Document Revised: 11/23/2011 Document Reviewed: 03/30/2011  [employer] Patient Information 2013 [employer].

## 2012-12-12 NOTE — Progress Notes (Signed)
Patient ID: [name], female   DOB: 1966-07-27, 47 y.o.   MRN: [id number]  Subjective   Complaint - Metrorrhagia Patient here for follow up of endometrial biopsy.  Had prior ultrasound showing area of cystic change in the myometrium.  Uterine measurements were 8.8 x 4.7 x 6.1 cm.  EMS 10.6.  Na masses seen.  Ovaries were within normal limit.  Patient states on Zonisimide for migraine prevention, working well.  Stopped Topamax due to hair loss.  Objective  Endometrial biopsy 11/23/12 - Secretory endometrium without atypia or malignancy.  Assessment  Metrorrhagia.  Possible adenomyosis. Migraine headaches.  Plan  Discussed progesterone therapy - cyclic, OrthoMicronor, Mirena/Skyla IUD, Ablation of endometrium, or possible hysterectomy.  Will start OrthoMicronor.

## 2013-05-09 ENCOUNTER — Other Ambulatory Visit: Payer: Self-pay | Admitting: Dermatology

## 2013-10-18 ENCOUNTER — Ambulatory Visit (INDEPENDENT_AMBULATORY_CARE_PROVIDER_SITE_OTHER): Payer: 59 | Admitting: Obstetrics and Gynecology

## 2013-10-18 ENCOUNTER — Encounter: Payer: Self-pay | Admitting: Obstetrics and Gynecology

## 2013-10-18 VITALS — BP 106/61 | HR 68 | Resp 16 | Ht 66.25 in | Wt 143.0 lb

## 2013-10-18 DIAGNOSIS — N921 Excessive and frequent menstruation with irregular cycle: Secondary | ICD-10-CM

## 2013-10-18 DIAGNOSIS — Z23 Encounter for immunization: Secondary | ICD-10-CM

## 2013-10-18 DIAGNOSIS — N72 Inflammatory disease of cervix uteri: Secondary | ICD-10-CM

## 2013-10-18 DIAGNOSIS — Z0000 Encounter for general adult medical examination without abnormal findings: Secondary | ICD-10-CM

## 2013-10-18 DIAGNOSIS — N765 Ulceration of vagina: Secondary | ICD-10-CM

## 2013-10-18 DIAGNOSIS — N7689 Other specified inflammation of vagina and vulva: Secondary | ICD-10-CM

## 2013-10-18 DIAGNOSIS — Z01419 Encounter for gynecological examination (general) (routine) without abnormal findings: Secondary | ICD-10-CM

## 2013-10-18 LAB — LIPID PANEL
CHOL/HDL RATIO: 3.2 ratio
CHOLESTEROL: 187 mg/dL (ref 0–200)
HDL: 58 mg/dL (ref >39)
LDL Cholesterol: 113 mg/dL — ABNORMAL HIGH (ref 0–99)
Triglycerides: 80 mg/dL (ref <150)
VLDL: 16 mg/dL (ref 0–40)

## 2013-10-18 LAB — CBC
HCT: 43.1 % (ref 36.0–46.0)
HEMOGLOBIN: 14.4 g/dL (ref 12.0–15.0)
MCH: 31.2 pg (ref 26.0–34.0)
MCHC: 33.4 g/dL (ref 30.0–36.0)
MCV: 93.5 fL (ref 78.0–100.0)
Platelets: 272 10*3/uL (ref 150–400)
RBC: 4.61 MIL/uL (ref 3.87–5.11)
RDW: 14.1 % (ref 11.5–15.5)
WBC: 8.2 10*3/uL (ref 4.0–10.5)

## 2013-10-18 LAB — COMPREHENSIVE METABOLIC PANEL
ALBUMIN: 4.7 g/dL (ref 3.5–5.2)
ALT: 13 U/L (ref 0–35)
AST: 15 U/L (ref 0–37)
Alkaline Phosphatase: 64 U/L (ref 39–117)
BILIRUBIN TOTAL: 0.4 mg/dL (ref 0.2–1.2)
BUN: 12 mg/dL (ref 6–23)
CALCIUM: 9.7 mg/dL (ref 8.4–10.5)
CO2: 26 meq/L (ref 19–32)
CREATININE: 0.66 mg/dL (ref 0.50–1.10)
Chloride: 104 mEq/L (ref 96–112)
Glucose, Bld: 85 mg/dL (ref 70–99)
POTASSIUM: 3.9 meq/L (ref 3.5–5.3)
Sodium: 139 mEq/L (ref 135–145)
TOTAL PROTEIN: 7.2 g/dL (ref 6.0–8.3)

## 2013-10-18 LAB — POCT URINALYSIS DIPSTICK
Bilirubin, UA: NEGATIVE
Glucose, UA: NEGATIVE
KETONES UA: NEGATIVE
Leukocytes, UA: NEGATIVE
NITRITE UA: NEGATIVE
PROTEIN UA: NEGATIVE
RBC UA: NEGATIVE
UROBILINOGEN UA: NEGATIVE
pH, UA: 5

## 2013-10-18 LAB — HEMOGLOBIN, FINGERSTICK: Hemoglobin, fingerstick: 14.4 g/dL (ref 12.0–16.0)

## 2013-10-18 MED ORDER — NORETHINDRONE 0.35 MG PO TABS: 1.0000 | ORAL_TABLET | Freq: Every day | ORAL | Status: DC

## 2013-10-18 NOTE — Progress Notes (Signed)
GYNECOLOGY VISIT  PCP: Carlus Pavlov, MD  Referring provider:   HPI: 48 y.o.   Married  Caucasian  female   [phone] with Patient's last menstrual period was 10/01/2013.   here for   Annual Exam, Long Menst Cycles. Menses are lighter and last now 10 days. Last cycle was 2 weeks. Rarely misses a Camilla OCP.   Takes within the hour. No real change in headaches.  On Zonisimide.   Ultrasound last year showed possible adenomyosis.  EMB showed secretory endometrium.  Hgb:  14.4 Urine:  neg  GYNECOLOGIC HISTORY: Patient's last menstrual period was 10/01/2013. Sexually active:  yes Partner preference: female Contraception:   Vasectomy/ Camilla Menopausal hormone therapy: no DES exposure:   no Blood transfusions:  Not sure Sexually transmitted diseases:   no GYN Procedures:  Cryo (1996) Mammogram:    07/2012 normal             Pap:   10/17/12 neg, HR HPV negative History of abnormal pap smear:  Yes   (1996)   OB History   Grav Para Term Preterm Abortions TAB SAB Ect Mult Living   5 3 2 1 2  2   3        LIFESTYLE: Exercise:   Runner, Cardio            Tobacco: no Alcohol: no Drug use:  no  OTHER HEALTH MAINTENANCE: Tetanus/TDap:  Not sure when  Gardisil: no Influenza: yes   Zostavax: no  Bone density: never Colonoscopy: never  Cholesterol check: 10/17/12 normal  Family History  Problem Relation Age of Onset  . Heart attack Father 48    while in [place], bypass  . Diabetes Maternal Grandfather     questionable  . Hypertension Mother   . Osteoporosis Mother   . Hypertension Maternal Grandmother   . Heart disease Maternal Grandmother     bypass  . Heart disease Paternal Grandfather     There are no active problems to display for this patient.  Past Medical History  Diagnosis Date  . Migraine   . Dysplasia of cervix 1996    cryo, no abn paps since  . Placenta accreta     Noted with last cesarean section 2007  . Abnormal Pap smear of cervix 1996   Cryo    Past Surgical History  Procedure Laterality Date  . Cryotherapy N/A 1996    cervix  . Cesarean section  03, 05, 07    ALLERGIES: Review of patient's allergies indicates no known allergies.  Current Outpatient Prescriptions  Medication Sig Dispense Refill  . norethindrone (MICRONOR,CAMILA,ERRIN) 0.35 MG tablet Take 1 tablet (0.35 mg total) by mouth daily.  3 Package  3  . SUMAtriptan Succinate (IMITREX PO) Take by mouth as needed (for migraines).      . zonisamide (ZONEGRAN) 100 MG capsule Take 400 mg by mouth daily.       No current facility-administered medications for this visit.     ROS:  Pertinent items are noted in HPI.  SOCIAL HISTORY:  Married.  Pharmacist.   PHYSICAL EXAMINATION:    BP 106/61  Pulse 68  Resp 16  Ht 5' 6.25" (1.683 m)  Wt 143 lb (64.864 kg)  BMI 22.90 kg/m2  LMP 10/01/2013   Wt Readings from Last 3 Encounters:  10/18/13 143 lb (64.864 kg)     Ht Readings from Last 3 Encounters:  10/18/13 5' 6.25" (1.683 m)    General appearance: alert, cooperative and appears stated  age Head: Normocephalic, without obvious abnormality, atraumatic Neck: no adenopathy, supple, symmetrical, trachea midline and thyroid not enlarged, symmetric, no tenderness/mass/nodules Lungs: clear to auscultation bilaterally Breasts: Inspection negative, No nipple retraction or dimpling, No nipple discharge or bleeding, No axillary or supraclavicular adenopathy, Normal to palpation without dominant masses Heart: regular rate and rhythm Abdomen: soft, non-tender; no masses,  no organomegaly Extremities: extremities normal, atraumatic, no cyanosis or edema Skin: Skin color, texture, turgor normal. No rashes or lesions Lymph nodes: Cervical, supraclavicular, and axillary nodes normal. No abnormal inguinal nodes palpated Neurologic: Grossly normal  Pelvic: External genitalia:  no lesions              Urethra:  normal appearing urethra with no masses, tenderness or  lesions              Bartholins and Skenes: normal                 Vagina:  Right vaginal apex ulcer measuring 2 cm diameter, friable with pap of the area and firm to the touch.   Feels like a band is under the vaginal mucosa.  Yellowish creamy discharge of the vagina.               Cervix: normal appearance              Pap and high risk HPV testing done: yes.            Bimanual Exam:  Uterus:  uterus is normal size, shape, consistency and nontender                                      Adnexa: normal adnexa in size, nontender and no masses                                      Rectovaginal: Confirms                                      Anus:  normal sphincter tone, no lesions  ASSESSMENT  Lingering menses. Right vaginal cuff ulcer. History of cryotherapy to cervix.   PLAN  Mammogram yearly.  Pap smear and high risk HPV testing of the cervix. Pap smear of the right vaginal cuff ulcer. Plan for a colposcopy with vaginal cuff biopsy.  Continue with Camilla. Discussed potential Mirena IUD, but this will depend of what is noted with the pap and vaginal biopsy.  TDap today.  CMP, lipid profile, CBC. Return annually or prn   An After Visit Summary was printed and given to the patient.

## 2013-10-18 NOTE — Patient Instructions (Signed)
EXERCISE AND DIET:  We recommended that you start or continue a regular exercise program for good health. Regular exercise means any activity that makes your heart beat faster and makes you sweat.  We recommend exercising at least 30 minutes per day at least 3 days a week, preferably 4 or 5.  We also recommend a diet low in fat and sugar.  Inactivity, poor dietary choices and obesity can cause diabetes, heart attack, stroke, and kidney damage, among others.    ALCOHOL AND SMOKING:  Women should limit their alcohol intake to no more than 7 drinks/beers/glasses of wine (combined, not each!) per week. Moderation of alcohol intake to this level decreases your risk of breast cancer and liver damage. And of course, no recreational drugs are part of a healthy lifestyle.  And absolutely no smoking or even second hand smoke. Most people know smoking can cause heart and lung diseases, but did you know it also contributes to weakening of your bones? Aging of your skin?  Yellowing of your teeth and nails?  CALCIUM AND VITAMIN D:  Adequate intake of calcium and Vitamin D are recommended.  The recommendations for exact amounts of these supplements seem to change often, but generally speaking 600 mg of calcium (either carbonate or citrate) and 800 units of Vitamin D per day seems prudent. Certain women may benefit from higher intake of Vitamin D.  If you are among these women, your doctor will have told you during your visit.    PAP SMEARS:  Pap smears, to check for cervical cancer or precancers,  have traditionally been done yearly, although recent scientific advances have shown that most women can have pap smears less often.  However, every woman still should have a physical exam from her gynecologist every year. It will include a breast check, inspection of the vulva and vagina to check for abnormal growths or skin changes, a visual exam of the cervix, and then an exam to evaluate the size and shape of the uterus and  ovaries.  And after 46 years of age, a rectal exam is indicated to check for rectal cancers. We will also provide age appropriate advice regarding health maintenance, like when you should have certain vaccines, screening for sexually transmitted diseases, bone density testing, colonoscopy, mammograms, etc.   MAMMOGRAMS:  All women over 40 years old should have a yearly mammogram. Many facilities now offer a "3D" mammogram, which may cost around $50 extra out of pocket. If possible,  we recommend you accept the option to have the 3D mammogram performed.  It both reduces the number of women who will be called back for extra views which then turn out to be normal, and it is better than the routine mammogram at detecting truly abnormal areas.    COLONOSCOPY:  Colonoscopy to screen for colon cancer is recommended for all women at age 50.  We know, you hate the idea of the prep.  We agree, BUT, having colon cancer and not knowing it is worse!!  Colon cancer so often starts as a polyp that can be seen and removed at colonscopy, which can quite literally save your life!  And if your first colonoscopy is normal and you have no family history of colon cancer, most women don't have to have it again for 10 years.  Once every ten years, you can do something that may end up saving your life, right?  We will be happy to help you get it scheduled when you are ready.    Be sure to check your insurance coverage so you understand how much it will cost.  It may be covered as a preventative service at no cost, but you should check your particular policy.     Endometriosis Endometriosis is a condition in which the tissue that lines the uterus (endometrium) grows outside of its normal location. The tissue may grow in many locations close to the uterus, but it commonly grows on the ovaries, fallopian tubes, vagina, or bowel. Because the uterus expels, or sheds, its lining every menstrual cycle, there is bleeding wherever the  endometrial tissue is located. This can cause pain because blood is irritating to tissues not normally exposed to it.  CAUSES  The cause of endometriosis is not known.  SIGNS AND SYMPTOMS  Often, there are no symptoms. When symptoms are present, they can vary with the location of the displaced tissue. Various symptoms can occur at different times. Although symptoms occur mainly during a woman's menstrual period, they can also occur midcycle and usually stop with menopause. Some people may go months with no symptoms at all. Symptoms may include:   Back or abdominal pain.   Heavier bleeding during periods.   Pain during intercourse.   Painful bowel movements.   Infertility. DIAGNOSIS  Your health care provider will do a physical exam and ask about your symptoms. Various tests may be done, such as:   Blood tests and urine tests. These are done to help rule out other problems.   Ultrasound. This test is done to look for abnormal tissue.   An X-ray of the lower bowel (barium enema).  Laparoscopy. In this procedure, a thin, lighted tube with a tiny camera on the end (laparoscope) is inserted into your abdomen. This helps your health care provider look for abnormal tissue to confirm the diagnosis. The health care provider may also remove a small piece of tissue (biopsy) from any abnormal tissue found. This tissue sample can then be sent to a lab so it can be looked at under a microscope. TREATMENT  Treatment will vary and may include:   Medicines to relieve pain. Nonsteroidal anti-inflammatory drugs (NSAIDs) are a type of pain medicine that can help to relieve the pain caused by endometriosis.  Hormonal therapy. When using hormonal therapy, periods are eliminated. This eliminates the monthly exposure to blood by the displaced endometrial tissue.   Surgery. Surgery may sometimes be done to remove the abnormal endometrial tissue. In severe cases, surgery may be done to remove the  fallopian tubes, uterus, and ovaries (hysterectomy). HOME CARE INSTRUCTIONS   Only take over-the-counter or prescription medicines for pain, discomfort, or fever as directed by your health care provider. Do not take aspirin because it may increase bleeding when you are not on hormonal therapy.   Avoid activities that produce pain, including sexual activity. SEEK MEDICAL CARE IF:  You have pelvic pain before, after, or during your periods.  You have pelvic pain between periods that gets worse during your period.  You have pelvic pain during or after sex.  You have pelvic pain with bowel movements or urination, especially during your period.  You have problems getting pregnant. SEEK IMMEDIATE MEDICAL CARE IF:   Your pain is severe and is not responding to pain medicine.   You have severe nausea and vomiting, or you cannot keep foods down.   You have pain that is limited to the right lower part of your abdomen.   You have swelling or increasing pain in your  abdomen.   You see blood in your stool.   You have a fever or persistent symptoms for more than 2 3 days.   You have a fever and your symptoms suddenly get worse. MAKE SURE YOU:   Understand these instructions.  Will watch your condition.  Will get help right away if you are not doing well or get worse. Document Released: 08/28/2000 Document Revised: 06/21/2013 Document Reviewed: 04/28/2013 [facility] Patient Information 2014 [place].

## 2013-10-20 LAB — IPS PAP TEST WITH HPV

## 2013-10-23 LAB — IPS PAP SMEAR ONLY

## 2013-10-25 ENCOUNTER — Telehealth: Payer: Self-pay | Admitting: Obstetrics and Gynecology

## 2013-10-25 NOTE — Telephone Encounter (Signed)
Spoke with patient and message from Dr. Quincy Simmonds given. Patient thinks she will start her menses at the end of February and that her cycle lasts for two weeks. Colposcopy scheduled for 11/22/13.  Okay to have March appointment? Also, will patient need cytotec prior to Colpo?

## 2013-10-25 NOTE — Telephone Encounter (Signed)
Message left to return call to Ryot Burrous at [phone].    

## 2013-10-25 NOTE — Telephone Encounter (Signed)
Message copied by Michele Mcalpine on Wed Oct 25, 2013  3:32 PM ------      Message from: Sigurd, BROOK E      Created: Tue Oct 24, 2013  2:13 PM       Please let patient know that both paps were normal and that her cervical HR HPV status was negative.      I still want her to come to the office for re-evaluation and colposcopy with vaginal wall biopsy.      I do not understand the cause of the vaginal ulceration I saw yet.       ------

## 2013-10-25 NOTE — Telephone Encounter (Signed)
I need to do colposcopy when patient is not on menstrual cycle.  This is really a colposcopy of the vagina.   She does not need Cytotec.  Pap of cervix and vagina were normal so a March colposcopy is OK with me.

## 2013-10-25 NOTE — Telephone Encounter (Signed)
Patient calling to see if second half of results are in.

## 2013-10-26 NOTE — Telephone Encounter (Signed)
Message from Dr. Quincy Simmonds given and patient agreeable to plan. Will call if on her cycle for colposcopy.  Will close encounter.

## 2013-11-22 ENCOUNTER — Telehealth: Payer: Self-pay | Admitting: Obstetrics and Gynecology

## 2013-11-22 ENCOUNTER — Ambulatory Visit (INDEPENDENT_AMBULATORY_CARE_PROVIDER_SITE_OTHER): Payer: 59 | Admitting: Obstetrics and Gynecology

## 2013-11-22 ENCOUNTER — Encounter: Payer: Self-pay | Admitting: Obstetrics and Gynecology

## 2013-11-22 VITALS — BP 118/70 | HR 70 | Ht 66.25 in | Wt 147.8 lb

## 2013-11-22 DIAGNOSIS — N765 Ulceration of vagina: Secondary | ICD-10-CM

## 2013-11-22 DIAGNOSIS — N926 Irregular menstruation, unspecified: Secondary | ICD-10-CM

## 2013-11-22 DIAGNOSIS — N7689 Other specified inflammation of vagina and vulva: Secondary | ICD-10-CM

## 2013-11-22 DIAGNOSIS — N939 Abnormal uterine and vaginal bleeding, unspecified: Secondary | ICD-10-CM

## 2013-11-22 DIAGNOSIS — N72 Inflammatory disease of cervix uteri: Secondary | ICD-10-CM

## 2013-11-22 MED ORDER — MISOPROSTOL 200 MCG PO TABS
200.0000 ug | ORAL_TABLET | Freq: Four times a day (QID) | ORAL | Status: DC
Start: 2013-11-22 — End: 2014-06-20

## 2013-11-22 NOTE — Telephone Encounter (Signed)
Patient was refunded her OOP $73.76 at check out. Patient said she did not have a biopsy done and Dr. Quincy Simmonds told her she could get the biopsy portion refunded. Gabriel Cirri was not in the office at the time. I told her I would check with Gabriel Cirri when she returned and get the [facility]. Patient said she would pay over the phone the correct amount. FYI: Patient also said she would be returning in two weeks for another procedure.

## 2013-11-22 NOTE — Progress Notes (Signed)
Patient ID: JOHNNA BOLLIER, female   DOB: 1965/10/17, 48 y.o.   MRN: [id number]  Subjective  Patient is here for colposcopy of the vagina to evaluate an ulcer in the right vaginal fornix noted on routine examination.  Pap of the vaginal ulceration area showed inflammation and no intraepithelial lesion, and pap of the cervix showed inflammation and no squamous intraepithelial lesion.  High risk HPV testing was negative.  LMP was 10/28/13. Lingering menses lasting 2 weeks. Prior ultrasound indicating potential adenomyosis. EMB benign. On Camilla.  Takes on time.   Has been on for almost one year.  Interested in [place].   Objective - pelvic exam  Normal external genitalia and urethra. Cervix and vagina no lesions.  Small fold/irregularity of the right vaginal cuff palpated and noted to be a 3 mm projection of the vaginal mucosa - normal. Uterus small and nontender. No adnexal masses or tenderness.   No colposcopy was performed.   Assessment  Abnormal menses. Lingering menses. Possible adenomyosis. Vaginal ulceration resolved spontaneously.  Plan  Will precert for Mirena IUD.  I discussed risks and benefits today. Return for IUD insertion.   15 minutes face to face time of which over 50% was spent in counseling.   After visit summary to patient.

## 2013-11-22 NOTE — Patient Instructions (Signed)
We will call you after we get approval for your Mirena IUD.   Please take the Cytotec the evening prior to your procedure and then the morning of your procedure.

## 2013-11-23 ENCOUNTER — Ambulatory Visit: Payer: 59 | Admitting: Obstetrics and Gynecology

## 2013-11-23 ENCOUNTER — Other Ambulatory Visit: Payer: Self-pay | Admitting: Obstetrics and Gynecology

## 2013-11-23 DIAGNOSIS — N926 Irregular menstruation, unspecified: Secondary | ICD-10-CM

## 2013-11-24 NOTE — Telephone Encounter (Signed)
[name],  It looks like she only had an office visit.   Thanks, Alinda Sierras

## 2013-11-24 NOTE — Telephone Encounter (Signed)
°  [name], What did the patient end up having done? The only thing that I pre-certed for her was the colpo. If she only had an office visit, then her copay would apply. Thanks.

## 2013-11-27 ENCOUNTER — Telehealth: Payer: Self-pay | Admitting: Obstetrics and Gynecology

## 2013-11-27 NOTE — Telephone Encounter (Signed)
Left message for patient to call back. Need to go over iud benefits °

## 2013-11-27 NOTE — Telephone Encounter (Signed)
[name], Patient liability for the office visit is $37.59. Her plan pays 80% after deductible. Her deductible has been met.  Thank you, Gabriel Cirri

## 2013-11-27 NOTE — Telephone Encounter (Signed)
Patient returned call. Advised patient that she will have 0 responsibility for insertion of iud. Patient is to call within the first 5 days of her cycle to scheduled insertion.

## 2013-12-18 ENCOUNTER — Telehealth: Payer: Self-pay | Admitting: Obstetrics and Gynecology

## 2013-12-18 NOTE — Telephone Encounter (Signed)
Spoke with patient. Patient states that she forgot that it is her daughters birthday on Thursday and has planned to go to her school and spend the day doing things with her. States she does not want to take the risk of not being able to enjoy her daughters birthday or ruining her day since it is recommended that she take it easy after the procedure. Patient states that she is not going to be able to make it work this month and will call back next month to schedule or if her plans change.  Routing to provider for final review. Patient agreeable to disposition. Will close encounter

## 2013-12-18 NOTE — Telephone Encounter (Signed)
How about 8:00 am this Thursday, 12/21/13? It looks like I have an opening.

## 2013-12-18 NOTE — Telephone Encounter (Signed)
Spoke with patient. Patient started cycle today and would like to schedule IUD insertion. Requesting Thursday appointment stating that this is the only day that she can come in. Advised Thursday with Dr.Silva is currently full but she could come in multiple times on Wednesday and Friday but patient declines. Offered appointment with different provider to try to accommodate schedule but patient declines. Patient states that she may be able to make some time on Wednesday work but she would have to call back if so. Restates that Thursday is really the only day that works well for her this week and that she may just have to wait until next month.  Dr.Silva, is there a time on Thursday you would like me to try to accommodate this patient? Or should I advise patient that she will need to wait until next month's menses?

## 2013-12-18 NOTE — Telephone Encounter (Signed)
Patient is calling to let us know she started her cycle today and wants to schedule her iud insertion.

## 2014-05-11 ENCOUNTER — Telehealth: Payer: Self-pay | Admitting: Obstetrics and Gynecology

## 2014-05-11 NOTE — Telephone Encounter (Signed)
Patient started her menstrual cycle 05/10/14 evening and is calling to schedule her IUD. She also has some questions for the nurse about the IUD.

## 2014-05-11 NOTE — Telephone Encounter (Signed)
Dr. Quincy Simmonds,  Spoke with patient to set up IUD appointment.  Patient states she has been having continuous cycle for two months. Not heavy all the time, just has to wear protection each day, notes that she has at least some spotting each day.Denies complaints of pain, dizziness, weakness, lightheaded. States she feels fine. She is wondering if IUD is still the best choice for her. I advised that IUD would help lighten cycles and per Dr. Elza Rafter last nights, suggested progesterone therapy.   Patient agreeable to scheduling. Scheduled for 05/14/14 at 1030 with Dr. Quincy Simmonds. She still has cytotec as prescribed by Dr. Quincy Simmonds.  Pre procedure instructions given.  Motrin instructions given. Motrin=Advil=Ibuprofen, 800 mg one hour before appointment. Eat a meal and hydrate well before appointment. Patient agreeable.  Routing to provider for final review. Patient agreeable to disposition. Will close encounter

## 2014-05-14 ENCOUNTER — Ambulatory Visit (INDEPENDENT_AMBULATORY_CARE_PROVIDER_SITE_OTHER): Payer: 59 | Admitting: Obstetrics and Gynecology

## 2014-05-14 ENCOUNTER — Encounter: Payer: Self-pay | Admitting: Obstetrics and Gynecology

## 2014-05-14 VITALS — BP 124/66 | HR 68 | Resp 18 | Ht 66.25 in | Wt 152.0 lb

## 2014-05-14 DIAGNOSIS — N939 Abnormal uterine and vaginal bleeding, unspecified: Secondary | ICD-10-CM

## 2014-05-14 DIAGNOSIS — N921 Excessive and frequent menstruation with irregular cycle: Secondary | ICD-10-CM

## 2014-05-14 DIAGNOSIS — N926 Irregular menstruation, unspecified: Secondary | ICD-10-CM

## 2014-05-14 DIAGNOSIS — Z30430 Encounter for insertion of intrauterine contraceptive device: Secondary | ICD-10-CM

## 2014-05-14 NOTE — Progress Notes (Signed)
GYNECOLOGY  VISIT   HPI: 48 y.o.   Married  Caucasian  female   [phone] with Patient's last menstrual period was 05/10/2014.   here for   Mirena IUD Insertion Bleeding has not stopped for 2 months straight.  Menses are heavy for 1 1/2 days which occurs monthly. Then just has spotting the rest of the time.   Prior ultrasound indicating potential adenomyosis.  EMB benign March 2014 prior to going on EPIC. On Camilla.  Interested in [place]: Patient's last menstrual period was 05/10/2014. Contraception:  Camilla   Menopausal hormone therapy: N/A        OB History   Grav Para Term Preterm Abortions TAB SAB Ect Mult Living   5 3 2 1 2  2   3          There are no active problems to display for this patient.   Past Medical History  Diagnosis Date  . Migraine   . Dysplasia of cervix 1996    cryo, no abn paps since  . Placenta accreta     Noted with last cesarean section 2007  . Abnormal Pap smear of cervix 1996    Cryo    Past Surgical History  Procedure Laterality Date  . Cryotherapy N/A 1996    cervix  . Cesarean section  03, 05, 07    Current Outpatient Prescriptions  Medication Sig Dispense Refill  . misoprostol (CYTOTEC) 200 MCG tablet Take 1 tablet (200 mcg total) by mouth 4 (four) times daily. Take 1 tablet (200 mcg total) by mouth the evening prior to procedure and then again the morning of procedure.  2 tablet  0  . norethindrone (MICRONOR,CAMILA,ERRIN) 0.35 MG tablet Take 1 tablet (0.35 mg total) by mouth daily.  3 Package  3  . SUMAtriptan Succinate (IMITREX PO) Take by mouth as needed (for migraines).      . zonisamide (ZONEGRAN) 100 MG capsule Take 400 mg by mouth daily.       No current facility-administered medications for this visit.     ALLERGIES: Review of patient's allergies indicates no known allergies.  Family History  Problem Relation Age of Onset  . Heart attack Father 50    while in [place], bypass  . Diabetes  Maternal Grandfather     questionable  . Hypertension Mother   . Osteoporosis Mother   . Hypertension Maternal Grandmother   . Heart disease Maternal Grandmother     bypass  . Heart disease Paternal Grandfather     History   Social History  . Marital Status: Married    Spouse Name: N/A    Number of Children: N/A  . Years of Education: N/A   Occupational History  . Not on file.   Social History Main Topics  . Smoking status: Never Smoker   . Smokeless tobacco: Never Used  . Alcohol Use: No  . Drug Use: No  . Sexual Activity: Yes    Partners: Male    Birth Control/ Protection: Surgical     Comment: vasectomy/ [place]   Other Topics Concern  . Not on file   Social History Narrative  . No narrative on file    ROS:  Pertinent items are noted in HPI.  PHYSICAL EXAMINATION:    BP 124/66  Pulse 68  Resp 18  Ht 5' 6.25" (1.683 m)  Wt 152 lb (68.947 kg)  BMI 24.34 kg/m2  LMP 05/10/2014     General appearance: alert, cooperative  and appears stated age Lungs: clear to auscultation bilaterally Heart: regular rate and rhythm Abdomen: soft, non-tender; no masses,  no organomegaly No abnormal inguinal nodes palpated  Pelvic: External genitalia:  no lesions              Urethra:  normal appearing urethra with no masses, tenderness or lesions              Bartholins and Skenes: normal                 Vagina: normal appearing vagina with normal color and discharge, no lesions              Cervix: normal appearance                   Bimanual Exam:  Uterus:  uterus is normal size, shape, consistency and nontender                                      Adnexa: normal adnexa in size, nontender and no masses                                     Mirena IUD insertion - Lot number [id number], Expiration 08/17.  Consent for procedure.  Speculum placed.  Sterile prep of cervix with Hibiclens.  Paracervical block with 10 cc 1% lidocaine - Lot number [id number], Expiration 02/13/15.   Uterus sounded to 7 cm. Mirena IUD placed without difficulty.  Strings trimmed.  No complications.  Minimal EBL.  ASSESSMENT  Metrorrhagia on Ortho Micronor. History of adenomyosis by ultrasound. Mirena IUD placement.  History of migraines.   PLAN  Discussion regarding options for care of metrorrhagia - Mirena IUD, Depo Provera, Ablation, hysterectomy. Risks and benefits reviewed.   Patient chose Mirena, so it was placed today.  Follow up in 5 weeks.    An After Visit Summary was printed and given to the patient.  __15____ minutes additional face to face time for counseling of metrorrhagia, of which over 50% was spent in counseling.

## 2014-05-14 NOTE — Patient Instructions (Signed)

## 2014-06-20 ENCOUNTER — Encounter: Payer: Self-pay | Admitting: Obstetrics and Gynecology

## 2014-06-20 ENCOUNTER — Ambulatory Visit (INDEPENDENT_AMBULATORY_CARE_PROVIDER_SITE_OTHER): Payer: 59 | Admitting: Obstetrics and Gynecology

## 2014-06-20 VITALS — BP 104/60 | HR 60 | Ht 66.25 in | Wt 152.0 lb

## 2014-06-20 DIAGNOSIS — Z30431 Encounter for routine checking of intrauterine contraceptive device: Secondary | ICD-10-CM

## 2014-06-20 DIAGNOSIS — N921 Excessive and frequent menstruation with irregular cycle: Secondary | ICD-10-CM

## 2014-06-20 NOTE — Progress Notes (Signed)
Patient ID: [name], female   DOB: 09-Jan-1966, 48 y.o.   MRN: [id number] GYNECOLOGY  VISIT   HPI: 48 y.o.   Married  Caucasian  female   [phone] with Patient's last menstrual period was 05/28/2014.--spotting daily since. here for  5 week follow up.   Had Mirena IUD inserted.  Spotting daily - can be bright red or brown.  Bleeding is very light.  No pain or dyspareunia.  Had cramping for one week and then this stopped.  GYNECOLOGIC HISTORY: Patient's last menstrual period was 05/28/2014. Contraception:  Mirena IUD  Menopausal hormone therapy: n/a        OB History   Grav Para Term Preterm Abortions TAB SAB Ect Mult Living   5 3 2 1 2  2   3          There are no active problems to display for this patient.   Past Medical History  Diagnosis Date  . Migraine   . Dysplasia of cervix 1996    cryo, no abn paps since  . Placenta accreta     Noted with last cesarean section 2007  . Abnormal Pap smear of cervix 1996    Cryo    Past Surgical History  Procedure Laterality Date  . Cryotherapy N/A 1996    cervix  . Cesarean section  03, 05, 07    Current Outpatient Prescriptions  Medication Sig Dispense Refill  . SUMAtriptan Succinate (IMITREX PO) Take by mouth as needed (for migraines).      . zonisamide (ZONEGRAN) 100 MG capsule Take 400 mg by mouth daily.       No current facility-administered medications for this visit.     ALLERGIES: Review of patient's allergies indicates no known allergies.  Family History  Problem Relation Age of Onset  . Heart attack Father 42    while in [place], bypass  . Diabetes Maternal Grandfather     questionable  . Hypertension Mother   . Osteoporosis Mother   . Hypertension Maternal Grandmother   . Heart disease Maternal Grandmother     bypass  . Heart disease Paternal Grandfather     History   Social History  . Marital Status: Married    Spouse Name: N/A    Number of Children: N/A  . Years of Education: N/A    Occupational History  . Not on file.   Social History Main Topics  . Smoking status: Never Smoker   . Smokeless tobacco: Never Used  . Alcohol Use: No  . Drug Use: No  . Sexual Activity: Yes    Partners: Male    Birth Control/ Protection: Surgical     Comment: vasectomy/ [place]   Other Topics Concern  . Not on file   Social History Narrative  . No narrative on file    ROS:  Pertinent items are noted in HPI.  PHYSICAL EXAMINATION:    BP 104/60  Pulse 60  Ht 5' 6.25" (1.683 m)  Wt 152 lb (68.947 kg)  BMI 24.34 kg/m2  LMP 05/28/2014     General appearance: alert, cooperative and appears stated age   Pelvic: External genitalia:  no lesions              Urethra:  normal appearing urethra with no masses, tenderness or lesions              Bartholins and Skenes: normal  Vagina: normal appearing vagina with normal color and discharge, no lesions.  Old blood in the vagina.                Cervix: normal appearance.  IUD strings not seen or felt.                    Bimanual Exam:  Uterus:  uterus is normal size, shape, consistency and nontender                                      Adnexa: normal adnexa in size, nontender and no masses                                                               ASSESSMENT  Metrorrhagia.  Status post Mirena IUD placement.    PLAN  Return for pelvic ultrasound to confirm location of IUD.    An After Visit Summary was printed and given to the patient.  __15____ minutes face to face time of which over 50% was spent in counseling.

## 2014-06-20 NOTE — Progress Notes (Signed)
Pelvic ultrasound scheduled for 06-28-14 at 9 am in office. Patient declined appointment for tomorrow due to schedule conflict.

## 2014-06-21 ENCOUNTER — Telehealth: Payer: Self-pay | Admitting: Obstetrics and Gynecology

## 2014-06-21 ENCOUNTER — Other Ambulatory Visit: Payer: 59 | Admitting: Obstetrics and Gynecology

## 2014-06-21 ENCOUNTER — Other Ambulatory Visit: Payer: 59

## 2014-06-21 NOTE — Telephone Encounter (Signed)
Spoke with patient. Advised of message as seen below from [place]. Patient states " I do not plan on reaching my deductible any time soon." Patient declines to have PUS done at the hospital. Patient would like to keep appointment scheduled for 10/15 at 9am with 9:30am consult with Dr.Silva.  Routing to provider for final review. Patient agreeable to disposition. Will close encounter

## 2014-06-21 NOTE — Telephone Encounter (Signed)
I did not see the strings for the IUD, so we really need to know if it is in the right position.  Other options are to do it at the hospital if they do a payment plan. Will patient be meeting her deductible soon, so that her financial picture would change?

## 2014-06-21 NOTE — Telephone Encounter (Signed)
Routing to Dr.Silva for review and advise. 

## 2014-06-21 NOTE — Telephone Encounter (Signed)
Message copied by Laqueta Due on Thu Jun 21, 2014 12:45 PM ------      Message from: Jaymes Graff      Created: Wed Jun 20, 2014 11:05 AM      Regarding: pus 10-15       Scheduled for 10-15      i believe dr Quincy Simmonds entered order, i did not link. patietn knows you will call ------

## 2014-06-21 NOTE — Telephone Encounter (Signed)
Spoke with patient. Advised that per benefit quote received she will be responsible to pay $465.96 when she comes in for PUS. Patient wants to know if this is "something that I absolutely have to do?"  Advised that triage would message the doctor and someone would call her to advise.

## 2014-06-21 NOTE — Telephone Encounter (Signed)
Left message for patient to call back. Need to go over PUS benefits. Pr $465.96

## 2014-06-25 ENCOUNTER — Telehealth: Payer: Self-pay | Admitting: Obstetrics and Gynecology

## 2014-06-25 NOTE — Telephone Encounter (Signed)
Call to patient. Advised that due to extenuating circumstances, we will not be able to perform ultrasounds in the office 10.15.2015. Patient understands and will call back to reschedule.

## 2014-06-28 ENCOUNTER — Other Ambulatory Visit: Payer: 59

## 2014-06-28 ENCOUNTER — Other Ambulatory Visit: Payer: 59 | Admitting: Obstetrics and Gynecology

## 2014-07-05 ENCOUNTER — Telehealth: Payer: Self-pay | Admitting: Obstetrics and Gynecology

## 2014-07-05 NOTE — Telephone Encounter (Signed)
LMTCB to reschedule AEX with Dr. Quincy Simmonds on 10/22/13 that has been cancelled.

## 2014-07-16 ENCOUNTER — Encounter: Payer: Self-pay | Admitting: Obstetrics and Gynecology

## 2014-08-17 ENCOUNTER — Telehealth: Payer: Self-pay | Admitting: Obstetrics and Gynecology

## 2014-08-17 NOTE — Telephone Encounter (Signed)
Call to patient. Scheduled PUS. Reminded patient that per benefit quote receive, she will be responsible to pay $478.77 when she comes in for PUS. Patient agreeable. Advised patient of 72 hour cancellation policy and $790 cancellation fee. Patient agreeable.

## 2014-08-17 NOTE — Telephone Encounter (Signed)
Pt calling to reschedule u/s appt

## 2014-08-23 ENCOUNTER — Encounter: Payer: Self-pay | Admitting: Obstetrics and Gynecology

## 2014-08-23 ENCOUNTER — Ambulatory Visit (INDEPENDENT_AMBULATORY_CARE_PROVIDER_SITE_OTHER): Payer: 59

## 2014-08-23 ENCOUNTER — Ambulatory Visit (INDEPENDENT_AMBULATORY_CARE_PROVIDER_SITE_OTHER): Payer: 59 | Admitting: Obstetrics and Gynecology

## 2014-08-23 VITALS — BP 114/70 | HR 76 | Ht 66.25 in | Wt 151.0 lb

## 2014-08-23 DIAGNOSIS — Z30431 Encounter for routine checking of intrauterine contraceptive device: Secondary | ICD-10-CM

## 2014-08-23 DIAGNOSIS — N939 Abnormal uterine and vaginal bleeding, unspecified: Secondary | ICD-10-CM

## 2014-08-23 DIAGNOSIS — N921 Excessive and frequent menstruation with irregular cycle: Secondary | ICD-10-CM

## 2014-08-23 NOTE — Progress Notes (Signed)
Subjective  Patient is here today for pelvic ultrasound for confirmation of Mirena  IUD position and re-evaluation of irregular vaginal bleeding.  Strings not seen on exam on 06/28/14.   Having daily drown spotting.  Stopped for 2 weeks.  Occasional lower right or left lower abdominal pain that is short lived.   Notes some increase in headaches but not occurring daily.  Had a course of prednisone.  Then developed gastritis symptoms. Improved with TUMS.   Reports new onset of swollen and chapped lower lip that lasts for about 24 hours.  Has occurred 3 times.   Not sure if lip changes and headaches are related to the Mirena IUD or not.   Objective   Images and report reviewed with patient.   Uterus with IUD in endometrial canal.  No masses.  EMS 3.75 mm. Normal ovaries.  Left ovary with 16 mmm CL cyst.  No free fluid.     Assessment  Mirena IUD patient.  Increased headaches.  Chapped lips/swelling.  Uncertain etiology.   Plan  Continuation of Mirena IUD. Follow up with headache specialist.  Already established.  See dentist if develops continuation of lip/mouth symptoms. Follow up for annual exam and prn.   15 minutes face to face time of which over 50% was spent in counseling.   After visit summary to patient.

## 2014-08-29 ENCOUNTER — Ambulatory Visit
Admission: RE | Admit: 2014-08-29 | Discharge: 2014-08-29 | Disposition: A | Discharge status: Home / Self Care | Payer: 59 | Source: Ambulatory Visit | Attending: Obstetrics and Gynecology | Admitting: Obstetrics and Gynecology

## 2014-08-29 DIAGNOSIS — Z01419 Encounter for gynecological examination (general) (routine) without abnormal findings: Secondary | ICD-10-CM

## 2014-10-22 ENCOUNTER — Ambulatory Visit: Payer: 59 | Admitting: Obstetrics and Gynecology

## 2014-11-14 ENCOUNTER — Encounter: Payer: Self-pay | Admitting: Obstetrics and Gynecology

## 2014-11-14 ENCOUNTER — Ambulatory Visit (INDEPENDENT_AMBULATORY_CARE_PROVIDER_SITE_OTHER): Payer: BLUE CROSS/BLUE SHIELD | Admitting: Obstetrics and Gynecology

## 2014-11-14 VITALS — BP 110/70 | HR 60 | Resp 16 | Ht 66.5 in | Wt 154.0 lb

## 2014-11-14 DIAGNOSIS — Z0000 Encounter for general adult medical examination without abnormal findings: Secondary | ICD-10-CM

## 2014-11-14 DIAGNOSIS — Z01419 Encounter for gynecological examination (general) (routine) without abnormal findings: Secondary | ICD-10-CM | POA: Diagnosis not present

## 2014-11-14 LAB — POCT URINALYSIS DIPSTICK
Bilirubin, UA: NEGATIVE
Blood, UA: NEGATIVE
GLUCOSE UA: NEGATIVE
Ketones, UA: NEGATIVE
Leukocytes, UA: NEGATIVE
Nitrite, UA: NEGATIVE
PH UA: 5
Protein, UA: NEGATIVE
Urobilinogen, UA: NEGATIVE

## 2014-11-14 LAB — CBC
HCT: 41.3 % (ref 36.0–46.0)
Hemoglobin: 13.6 g/dL (ref 12.0–15.0)
MCH: 31.6 pg (ref 26.0–34.0)
MCHC: 32.9 g/dL (ref 30.0–36.0)
MCV: 96 fL (ref 78.0–100.0)
MPV: 11.4 fL (ref 8.6–12.4)
Platelets: 180 10*3/uL (ref 150–400)
RBC: 4.3 MIL/uL (ref 3.87–5.11)
RDW: 13.2 % (ref 11.5–15.5)
WBC: 5.8 10*3/uL (ref 4.0–10.5)

## 2014-11-14 LAB — COMPREHENSIVE METABOLIC PANEL
ALBUMIN: 4.3 g/dL (ref 3.5–5.2)
ALK PHOS: 48 U/L (ref 39–117)
ALT: 20 U/L (ref 0–35)
AST: 16 U/L (ref 0–37)
BILIRUBIN TOTAL: 0.5 mg/dL (ref 0.2–1.2)
BUN: 15 mg/dL (ref 6–23)
CALCIUM: 9.1 mg/dL (ref 8.4–10.5)
CO2: 24 mEq/L (ref 19–32)
Chloride: 109 mEq/L (ref 96–112)
Creat: 0.71 mg/dL (ref 0.50–1.10)
Glucose, Bld: 84 mg/dL (ref 70–99)
POTASSIUM: 4.2 meq/L (ref 3.5–5.3)
SODIUM: 139 meq/L (ref 135–145)
TOTAL PROTEIN: 6.6 g/dL (ref 6.0–8.3)

## 2014-11-14 LAB — HEMOGLOBIN, FINGERSTICK: HEMOGLOBIN, FINGERSTICK: 13.6 g/dL (ref 12.0–16.0)

## 2014-11-14 LAB — TSH: TSH: 0.953 u[IU]/mL (ref 0.350–4.500)

## 2014-11-14 LAB — LIPID PANEL
CHOL/HDL RATIO: 2.9 ratio
Cholesterol: 188 mg/dL (ref 0–200)
HDL: 64 mg/dL (ref >=46)
LDL Cholesterol: 114 mg/dL — ABNORMAL HIGH (ref 0–99)
Triglycerides: 50 mg/dL (ref <150)
VLDL: 10 mg/dL (ref 0–40)

## 2014-11-14 NOTE — Progress Notes (Signed)
Patient ID: [name], female   DOB: 08-08-1966, 49 y.o.   MRN: [id number] 49 y.o. [id number] MarriedCaucasianF here for annual exam.   PCP:  Carlus Pavlov, MD  Has Mirena for treatment of abnormal uterine bleeding.  Pelvic ultrasound normal 08/23/14 - IUD in endometrial canal.  IUD strings not previously seen. Has a little bit of bleeding for a week in total once per month.  This is much better each month.  No pain.  History of migraine headaches.  Occuring more often.  May consider other options.   Doing fill in work as Software engineer.  Kids lives are really busy.   Family history of father and paternal uncle with aortic valve replacement.   No LMP recorded. Patient is not currently having periods (Reason: IUD).          Sexually active: Yes.   female partner The current method of family planning is vasectomy.--also has Mirena IUD.    Exercising: No.  none. Smoker:  no  Health Maintenance: Pap:  11-07-13 wnl:neg HR HPV History of abnormal Pap:  Yes, 1996 Hx of cryotherapy to cervix.  Paps normal since. MMG:  08-29-14 extremely dense[facility] Colonoscopy:  n/a BMD:   n/a TDaP:  n/a Screening Labs:  Hb today: 13.6, Urine today: Neg   reports that she has never smoked. She has never used smokeless tobacco. She reports that she does not drink alcohol or use illicit drugs.  Past Medical History  Diagnosis Date  . Migraine   . Dysplasia of cervix 1996    cryo, no abn paps since  . Placenta accreta     Noted with last cesarean section 2007  . Abnormal Pap smear of cervix 1996    Cryo    Past Surgical History  Procedure Laterality Date  . Cryotherapy N/A 1996    cervix  . Cesarean section  03, 05, 07    Current Outpatient Prescriptions  Medication Sig Dispense Refill  . zonisamide (ZONEGRAN) 100 MG capsule Take 400 mg by mouth daily.    . SUMAtriptan (IMITREX) 100 MG tablet      No current facility-administered medications for this visit.    Family History   Problem Relation Age of Onset  . Heart attack Father 44    while in [place], bypass  . Diabetes Maternal Grandfather     questionable  . Hypertension Mother   . Osteoporosis Mother   . Hypertension Maternal Grandmother   . Heart disease Maternal Grandmother     bypass  . Heart disease Paternal Grandfather     ROS:  Pertinent items are noted in HPI.  Otherwise, a comprehensive ROS was negative.  Exam:   BP 110/70 mmHg  Pulse 60  Resp 16  Ht 5' 6.5" (1.689 m)  Wt 154 lb (69.854 kg)  BMI 24.49 kg/m2     Height: 5' 6.5" (168.9 cm)  Ht Readings from Last 3 Encounters:  11/14/14 5' 6.5" (1.689 m)  08/23/14 5' 6.25" (1.683 m)  06/20/14 5' 6.25" (1.683 m)    General appearance: alert, cooperative and appears stated age Head: Normocephalic, without obvious abnormality, atraumatic Neck: no adenopathy, supple, symmetrical, trachea midline and thyroid normal to inspection and palpation Lungs: clear to auscultation bilaterally Breasts: normal appearance, no masses or tenderness, Inspection negative, No nipple retraction or dimpling, No nipple discharge or bleeding, No axillary or supraclavicular adenopathy Heart: regular rate and rhythm Abdomen: soft, non-tender; bowel sounds normal; no masses,  no organomegaly Extremities: extremities  normal, atraumatic, no cyanosis or edema Skin: Skin color, texture, turgor normal. No rashes or lesions Lymph nodes: Cervical, supraclavicular, and axillary nodes normal. No abnormal inguinal nodes palpated Neurologic: Grossly normal   Pelvic: External genitalia:  no lesions              Urethra:  normal appearing urethra with no masses, tenderness or lesions              Bartholins and Skenes: normal                 Vagina: normal appearing vagina with normal color and discharge, no lesions              Cervix: no lesions and Old mucous blood.  Strings not seen.              Pap taken: Yes.   Bimanual Exam:  Uterus:  normal size, contour,  position, consistency, mobility, non-tender              Adnexa: normal adnexa and no mass, fullness, tenderness               Rectovaginal: Confirms               Anus:  normal sphincter tone, no lesions  Chaperone was present for exam.  A:  Well Woman with normal exam Remote history of cryo to cervix.  Mirena IUD patient.  Prior ultrasound confirming position.  Dense breasts.  FH of aortic valve disease.   P:   Mammogram - 3D yearly.  pap smear performed.  Routine labs. Discuss cardiovascular family history with PCP.  return annually or prn

## 2014-11-16 LAB — IPS PAP TEST WITH HPV

## 2015-12-04 ENCOUNTER — Ambulatory Visit: Payer: BLUE CROSS/BLUE SHIELD | Admitting: Obstetrics and Gynecology

## 2015-12-23 ENCOUNTER — Other Ambulatory Visit: Payer: Self-pay

## 2015-12-23 DIAGNOSIS — Z1231 Encounter for screening mammogram for malignant neoplasm of breast: Secondary | ICD-10-CM

## 2016-01-06 ENCOUNTER — Ambulatory Visit
Admission: RE | Admit: 2016-01-06 | Discharge: 2016-01-06 | Disposition: A | Discharge status: Home / Self Care | Payer: 59 | Source: Ambulatory Visit

## 2016-01-06 ENCOUNTER — Telehealth: Payer: Self-pay | Admitting: Obstetrics and Gynecology

## 2016-01-06 DIAGNOSIS — Z1231 Encounter for screening mammogram for malignant neoplasm of breast: Secondary | ICD-10-CM

## 2016-01-06 NOTE — Telephone Encounter (Signed)
Spoke with patient. Patient states that [facility] Reports she was unable to have her screening as she has been experiencing discomfort in her breast when she moves certain ways. [facility] advised the patient she will need an order from Dr.Silva to have a diagnostic mammogram performed. Advised she will need to be seen in the office for a breast check before order for proper imaging can be placed. She is agreeable. Patient states she was due for her aex in March and was unable to be seen until May. She is concerned about waiting until May. Aex appointment moved to 01/09/2016 with Dr.Silva. Advised she will have a breast check at this appointment and we will help to schedule her imaging. She is agreeable.  Routing to provider for final review. Patient agreeable to disposition. Will close encounter.

## 2016-01-06 NOTE — Telephone Encounter (Signed)
Patient wants to speak with the nurse no information given. °

## 2016-01-07 ENCOUNTER — Other Ambulatory Visit: Payer: Self-pay | Admitting: Obstetrics and Gynecology

## 2016-01-07 DIAGNOSIS — N644 Mastodynia: Secondary | ICD-10-CM

## 2016-01-09 ENCOUNTER — Encounter: Payer: Self-pay | Admitting: Obstetrics and Gynecology

## 2016-01-09 ENCOUNTER — Ambulatory Visit (INDEPENDENT_AMBULATORY_CARE_PROVIDER_SITE_OTHER): Payer: 59 | Admitting: Obstetrics and Gynecology

## 2016-01-09 VITALS — BP 118/66 | HR 74 | Resp 14 | Ht 65.75 in | Wt 158.0 lb

## 2016-01-09 DIAGNOSIS — N644 Mastodynia: Secondary | ICD-10-CM | POA: Diagnosis not present

## 2016-01-09 DIAGNOSIS — Z01419 Encounter for gynecological examination (general) (routine) without abnormal findings: Secondary | ICD-10-CM

## 2016-01-09 DIAGNOSIS — Z0000 Encounter for general adult medical examination without abnormal findings: Secondary | ICD-10-CM

## 2016-01-09 LAB — POCT URINALYSIS DIPSTICK
BILIRUBIN UA: NEGATIVE
Glucose, UA: NEGATIVE
Ketones, UA: NEGATIVE
Leukocytes, UA: NEGATIVE
Nitrite, UA: NEGATIVE
Protein, UA: NEGATIVE
RBC UA: NEGATIVE
Urobilinogen, UA: NEGATIVE
pH, UA: 6

## 2016-01-09 NOTE — Progress Notes (Signed)
50 y.o. [id number] Married Caucasian female here for annual exam.    Feels sensation under the left breast.  Feels like a bruise.  Occurring off and on for 4 - 6 weeks. No injury.  Feels it more if sitting or if bends over.  Left collar bone sore for a couple of weeks, but this resolved.  Naproxen every now and then for headaches.   Menses occur every other month.  Very light and last 1 -2 days.   Did labs through work.  T chol 222. Glucose was normal.   PCP:   Carlus Pavlov   No LMP recorded. Patient is not currently having periods (Reason: IUD).           Sexually active: Yes.    The current method of family planning is IUD Mirena Inserted 05/14/14 Exercising: Yes.    Walking Smoker:  no  Health Maintenance: Pap:  11/14/14 Neg. HR HPV:neg History of abnormal Pap:  Yes.  Yes, 1996 Hx of cryotherapy to cervix. Paps normal since MMG:  08/29/14 BIRADS1:neg Colonoscopy:  Never BMD:   Never  TDaP:  10/18/13  Gardasil:   N/A HIV: Neg in pregnancy.  Hep C: No Screening Labs: Done thru work, Urine today: negative   reports that she has never smoked. She has never used smokeless tobacco. She reports that she does not drink alcohol or use illicit drugs.  Past Medical History  Diagnosis Date  . Migraine   . Dysplasia of cervix 1996    cryo, no abn paps since  . Placenta accreta     Noted with last cesarean section 2007  . Abnormal Pap smear of cervix 1996    Cryo    Past Surgical History  Procedure Laterality Date  . Cryotherapy N/A 1996    cervix  . Cesarean section  03, 05, 07    Current Outpatient Prescriptions  Medication Sig Dispense Refill  . levonorgestrel (MIRENA) 20 MCG/24HR IUD 1 each by Intrauterine route once. Inserted 05/14/14    . naproxen (NAPROSYN) 500 MG tablet TAKE 1 TABLET AS NEEDED FOR HEADACHE UP TO TWICE A DAY . LIMIT USE TO 1 TO 2 DAYS/WK.  0  . SUMAtriptan (IMITREX) 100 MG tablet     . zonisamide (ZONEGRAN) 100 MG capsule Take 300 mg by mouth daily.       No current facility-administered medications for this visit.    Family History  Problem Relation Age of Onset  . Heart attack Father 68    while in [place], bypass  . Diabetes Father   . Diabetes Maternal Grandfather     questionable  . Hypertension Mother   . Osteoporosis Mother   . Hypertension Maternal Grandmother   . Heart disease Maternal Grandmother     bypass  . Heart disease Paternal Grandfather     ROS:  Pertinent items are noted in HPI.  Otherwise, a comprehensive ROS was negative.  Exam:   BP 118/66 mmHg  Pulse 74  Resp 14  Ht 5' 5.75" (1.67 m)  Wt 158 lb (71.668 kg)  BMI 25.70 kg/m2    General appearance: alert, cooperative and appears stated age Head: Normocephalic, without obvious abnormality, atraumatic Neck: no adenopathy, supple, symmetrical, trachea midline and thyroid normal to inspection and palpation Lungs: clear to auscultation bilaterally Breasts: normal appearance, no masses or tenderness, Inspection negative, No nipple retraction or dimpling, No nipple discharge or bleeding, No axillary or supraclavicular adenopathy Heart: regular rate and rhythm Abdomen: incisions:  Yes.  Pfannenstiel , soft, non-tender; no masses, no organomegaly Extremities: extremities normal, atraumatic, no cyanosis or edema Skin: Skin color, texture, turgor normal. No rashes or lesions Lymph nodes: Cervical, supraclavicular, and axillary nodes normal. No abnormal inguinal nodes palpated Neurologic: Grossly normal  Pelvic: External genitalia:  no lesions              Urethra:  normal appearing urethra with no masses, tenderness or lesions              Bartholins and Skenes: normal                 Vagina: normal appearing vagina with normal color and discharge, no lesions              Cervix: no lesions and IUD strings not seen.              Pap taken: No. Bimanual Exam:  Uterus:  normal size, contour, position, consistency, mobility, non-tender               Adnexa: normal adnexa and no mass, fullness, tenderness              Rectal exam: Yes.  .  Confirms.              Anus:  normal sphincter tone, no lesions  Chaperone was present for exam.  Assessment:   Well woman visit with normal exam. Remote history of cryo to cervix.  Mirena IUD patient. Prior ultrasound confirming position.  Dense breasts.  Left breast pain.  FH of aortic valve disease.  FH of AODM.  Plan: Yearly mammogram recommended after age 90.  Bilateral diagnostic mammogram and left breast ultrasound.  Recommended self breast exam.  Pap and HR HPV as above. Guidelines for Calcium, Vitamin D, regular exercise program including cardiovascular and weight bearing exercise. Discussed low sugar and low carb diet. Labs performed.  No..     Prescription medication(s) given.  No..   Follow up annually and prn.      After visit summary provided.

## 2016-01-09 NOTE — Progress Notes (Signed)
Diagnostic MMG and ultrasound of left breast scheduled for 01-14-16 at 8:20 at [facility]. Patient agreeable to appointment date and time.

## 2016-01-09 NOTE — Patient Instructions (Signed)

## 2016-01-14 ENCOUNTER — Ambulatory Visit
Admission: RE | Admit: 2016-01-14 | Discharge: 2016-01-14 | Disposition: A | Discharge status: Home / Self Care | Payer: 59 | Source: Ambulatory Visit | Attending: Obstetrics and Gynecology | Admitting: Obstetrics and Gynecology

## 2016-01-14 DIAGNOSIS — N644 Mastodynia: Secondary | ICD-10-CM

## 2016-01-23 ENCOUNTER — Ambulatory Visit: Payer: BLUE CROSS/BLUE SHIELD | Admitting: Obstetrics and Gynecology

## 2016-09-14 DIAGNOSIS — E7800 Pure hypercholesterolemia, unspecified: Secondary | ICD-10-CM

## 2016-09-14 HISTORY — DX: Pure hypercholesterolemia, unspecified: E78.00

## 2016-10-12 DIAGNOSIS — G43019 Migraine without aura, intractable, without status migrainosus: Secondary | ICD-10-CM | POA: Diagnosis not present

## 2016-10-12 DIAGNOSIS — G43719 Chronic migraine without aura, intractable, without status migrainosus: Secondary | ICD-10-CM | POA: Diagnosis not present

## 2016-12-23 DIAGNOSIS — G43019 Migraine without aura, intractable, without status migrainosus: Secondary | ICD-10-CM | POA: Diagnosis not present

## 2016-12-23 DIAGNOSIS — G43719 Chronic migraine without aura, intractable, without status migrainosus: Secondary | ICD-10-CM | POA: Diagnosis not present

## 2016-12-23 LAB — LIPID PANEL
Cholesterol: 270 mg/dL — AB (ref 0–200)
HDL: 67 mg/dL (ref 35–70)
LDL Cholesterol: 185 mg/dL
TRIGLYCERIDES: 91 mg/dL (ref 40–160)

## 2016-12-23 LAB — BASIC METABOLIC PANEL: Glucose: 83 mg/dL

## 2017-01-12 DIAGNOSIS — L821 Other seborrheic keratosis: Secondary | ICD-10-CM | POA: Diagnosis not present

## 2017-01-12 DIAGNOSIS — L814 Other melanin hyperpigmentation: Secondary | ICD-10-CM | POA: Diagnosis not present

## 2017-01-12 DIAGNOSIS — D1801 Hemangioma of skin and subcutaneous tissue: Secondary | ICD-10-CM | POA: Diagnosis not present

## 2017-01-13 ENCOUNTER — Ambulatory Visit: Payer: 59 | Admitting: Obstetrics and Gynecology

## 2017-01-25 NOTE — Progress Notes (Signed)
51 y.o. [id number] Married Caucasian female here for annual exam.    Occasional cycle with Mirena.  No hot flashes.   Had cholesterol checked - T chol - 270, LDL 185. Glucose 83.  Patient complaining of urinary frequency and thinks she may have a UTI.  Urine dip:  1+ WBCs, trace RBCs, positive nitrites.   PCP:  None  No LMP recorded. Patient is not currently having periods (Reason: IUD).     Period Cycle (Days):  (no cycles due to Mirena IUD)      Sexually active: Yes.   female The current method of family planning is Vasectomy/IUD--Mirena inserted 05-14-14.    Exercising: Yes.    walking Smoker:  no  Health Maintenance: Pap:11-14-14 Neg:Neg HR HPV         10-18-13 Neg:Neg HR HPV History of abnormal Pap:  Yes, 1996 Hx of cryotherapy to cervix. Paps normal since. MMG: 01-14-16 Diag.Bil. Density D/Neg/BiRads2/screening 1year/TBC(diag.done for abnormal sensation Lt.breast) Colonoscopy:  n/a BMD:   n/a  Result  n/a TDaP:  10-18-13 Gardasil:   no HIV: Neg during pregnancy Hep C:  NA. Screening Labs:  Hb today: 12-23-16 at [facility], Urine today: 1+WBCS, Tr.RBCs, Pos.Nitrites   reports that she has never smoked. She has never used smokeless tobacco. She reports that she does not drink alcohol or use drugs.  Past Medical History:  Diagnosis Date  . Abnormal Pap smear of cervix 1996   Cryo  . Dysplasia of cervix 1996   cryo, no abn paps since  . Hypercholesteremia 2018  . Migraine    w/o aura  . Placenta accreta    Noted with last cesarean section 2007    Past Surgical History:  Procedure Laterality Date  . CESAREAN SECTION  03, 05, 07  . CRYOTHERAPY N/A 1996   cervix    Current Outpatient Prescriptions  Medication Sig Dispense Refill  . gabapentin (NEURONTIN) 600 MG tablet Take 1 tablet by mouth 2 (two) times daily.  2  . Krill Oil 350 MG CAPS Take 1 tablet by mouth daily.    Marland Kitchen levonorgestrel (MIRENA) 20 MCG/24HR IUD 1 each by Intrauterine route once. Inserted 05/14/14    .  naproxen (NAPROSYN) 500 MG tablet TAKE 1 TABLET AS NEEDED FOR HEADACHE UP TO TWICE A DAY . LIMIT USE TO 1 TO 2 DAYS/WK.  0  . SUMAtriptan (IMITREX) 100 MG tablet     . zonisamide (ZONEGRAN) 100 MG capsule Take 300 mg by mouth daily.      No current facility-administered medications for this visit.     Family History  Problem Relation Age of Onset  . Heart attack Father 10       while in [place], bypass  . Diabetes Father   . Hypertension Mother   . Osteoporosis Mother   . Diabetes Maternal Grandfather        questionable  . Hypertension Maternal Grandmother   . Heart disease Maternal Grandmother        bypass  . Heart disease Paternal Grandfather     ROS:  Pertinent items are noted in HPI.  Otherwise, a comprehensive ROS was negative.  Exam:   BP 102/60 (BP Location: Right Arm, Patient Position: Sitting, Cuff Size: Normal)   Pulse 68   Resp 16   Ht 5' 6.25" (1.683 m)   Wt 160 lb (72.6 kg)   BMI 25.63 kg/m     General appearance: alert, cooperative and appears stated age Head: Normocephalic, without obvious  abnormality, atraumatic Neck: no adenopathy, supple, symmetrical, trachea midline and thyroid normal to inspection and palpation Lungs: clear to auscultation bilaterally Breasts: normal appearance, no masses or tenderness, No nipple retraction or dimpling, No nipple discharge or bleeding, No axillary or supraclavicular adenopathy Heart: regular rate and rhythm Abdomen: soft, non-tender; no masses, no organomegaly Extremities: extremities normal, atraumatic, no cyanosis or edema Skin: Skin color, texture, turgor normal. No rashes or lesions Lymph nodes: Cervical, supraclavicular, and axillary nodes normal. No abnormal inguinal nodes palpated Neurologic: Grossly normal  Pelvic: External genitalia:  no lesions              Urethra:  normal appearing urethra with no masses, tenderness or lesions              Bartholins and Skenes: normal                 Vagina:  normal appearing vagina with normal color and discharge, no lesions              Cervix: no lesions.  No IUD strings seen.  IUD previously seen on pelvic ultrasound.              Pap taken: No. Bimanual Exam:  Uterus:  normal size, contour, position, consistency, mobility, non-tender              Adnexa: no mass, fullness, tenderness              Rectal exam: Yes.  .  Confirms.              Anus:  normal sphincter tone, no lesions  Chaperone was present for exam.  Assessment:   Well woman visit with normal exam. Mirena IUD patient.  IUD strings not seen but has had previous ultrasound.  Migraines.  Uses Imitrex. UTI.  Elevated cholesterol. FH CAD.   Plan: Mammogram screening discussed.  She will schedule. Recommended self breast awareness. Pap and HR HPV as above. Guidelines for Calcium, Vitamin D, regular exercise program including cardiovascular and weight bearing exercise. Urine micro and culture.  Bactrim DS po bid x 3 days.  Will refer to internist to have care for hyperlipidemia.  Follow up annually and prn.   After visit summary provided.

## 2017-01-27 ENCOUNTER — Encounter: Payer: Self-pay | Admitting: Obstetrics and Gynecology

## 2017-01-27 ENCOUNTER — Ambulatory Visit (INDEPENDENT_AMBULATORY_CARE_PROVIDER_SITE_OTHER): Payer: 59 | Admitting: Obstetrics and Gynecology

## 2017-01-27 VITALS — BP 102/60 | HR 68 | Resp 16 | Ht 66.25 in | Wt 160.0 lb

## 2017-01-27 DIAGNOSIS — R8290 Unspecified abnormal findings in urine: Secondary | ICD-10-CM

## 2017-01-27 DIAGNOSIS — E785 Hyperlipidemia, unspecified: Secondary | ICD-10-CM

## 2017-01-27 DIAGNOSIS — Z01419 Encounter for gynecological examination (general) (routine) without abnormal findings: Secondary | ICD-10-CM | POA: Diagnosis not present

## 2017-01-27 DIAGNOSIS — R350 Frequency of micturition: Secondary | ICD-10-CM | POA: Diagnosis not present

## 2017-01-27 LAB — POCT URINALYSIS DIPSTICK
BILIRUBIN UA: NEGATIVE
GLUCOSE UA: NEGATIVE
Ketones, UA: NEGATIVE
NITRITE UA: POSITIVE
PH UA: 5 (ref 5.0–8.0)
Protein, UA: NEGATIVE
UROBILINOGEN UA: 0.2 U/dL

## 2017-01-27 MED ORDER — SULFAMETHOXAZOLE-TRIMETHOPRIM 800-160 MG PO TABS: 1.0000 | ORAL_TABLET | Freq: Two times a day (BID) | ORAL | 0 refills | Status: DC

## 2017-01-27 NOTE — Progress Notes (Signed)
Patient scheduled while in office. Spoke with Langley Gauss at [facility] at [facility]. Patient scheduled with Dr. Lorelei Pont on 03/03/17 at 12pm. Patient is agreeable to date and time. Copy of las dated 12/23/16 faxed to [phone].

## 2017-01-27 NOTE — Patient Instructions (Signed)

## 2017-01-28 LAB — URINALYSIS, MICROSCOPIC ONLY
Casts: NONE SEEN [LPF]
RBC / HPF: NONE SEEN RBC/HPF (ref <=2)
YEAST: NONE SEEN [HPF]

## 2017-01-29 ENCOUNTER — Telehealth: Payer: Self-pay | Admitting: *Deleted

## 2017-01-29 LAB — URINE CULTURE

## 2017-01-29 NOTE — Telephone Encounter (Signed)
Left message to call Kaylon Hitz at [phone].  

## 2017-01-29 NOTE — Telephone Encounter (Signed)
-----   Message from Nunzio Cobbs, MD sent at 01/29/2017  2:14 PM EDT ----- Please let patient know that her urine culture is showing staphylococcus so far.  Sensitivities are pending.  I placed her on Bactrim.  How is she feeling?

## 2017-01-29 NOTE — Telephone Encounter (Signed)
Spoke with patient and notified of urine culture results and micro results. Patient states feeling better. Advised will call back with sensitivities.

## 2017-01-29 NOTE — Telephone Encounter (Signed)
Received Result Summary for continuity of care, new patient appointment on 03/03/17, forwarded to provider/SLS 05/18

## 2017-02-01 ENCOUNTER — Telehealth: Payer: Self-pay | Admitting: *Deleted

## 2017-02-01 NOTE — Telephone Encounter (Signed)
Left message to call Sharee Pimple at [phone].    Notes recorded by Salvadore Dom, MD on 02/01/2017 at 11:00 AM EDT The patient is on Bactrim, sensitivities have returned and her infection is sensitive to Bactrim. ------  Notes recorded by Burnice Logan, RN on 02/01/2017 at 10:23 AM EDT Copy of results to Dr. Talbert Nan for review.  Cc: Dr. Quincy Simmonds ------  Notes recorded by Salvadore Dom, MD on 01/31/2017 at 1:18 PM EDT Can you check if they are doing sensitivities? ------

## 2017-02-02 ENCOUNTER — Encounter: Payer: Self-pay | Admitting: Family Medicine

## 2017-02-02 NOTE — Telephone Encounter (Signed)
Patient is returning a call to [place]. Please call mobile number.

## 2017-02-02 NOTE — Telephone Encounter (Signed)
Spoke with patient. Advised of results as seen below from [place]. Patient verbalizes understanding stating that she feels much better since starting Bactrim. Will monitor symptoms and notify the office if symptoms return.  Routing to provider for final review. Patient agreeable to disposition. Will close encounter.

## 2017-02-22 ENCOUNTER — Other Ambulatory Visit: Payer: Self-pay | Admitting: Obstetrics and Gynecology

## 2017-02-22 DIAGNOSIS — Z1231 Encounter for screening mammogram for malignant neoplasm of breast: Secondary | ICD-10-CM

## 2017-02-23 DIAGNOSIS — G43719 Chronic migraine without aura, intractable, without status migrainosus: Secondary | ICD-10-CM | POA: Diagnosis not present

## 2017-02-23 DIAGNOSIS — G43019 Migraine without aura, intractable, without status migrainosus: Secondary | ICD-10-CM | POA: Diagnosis not present

## 2017-02-24 ENCOUNTER — Ambulatory Visit
Admission: RE | Admit: 2017-02-24 | Discharge: 2017-02-24 | Disposition: A | Discharge status: Home / Self Care | Payer: 59 | Source: Ambulatory Visit | Attending: Obstetrics and Gynecology | Admitting: Obstetrics and Gynecology

## 2017-02-24 DIAGNOSIS — Z1231 Encounter for screening mammogram for malignant neoplasm of breast: Secondary | ICD-10-CM

## 2017-02-25 ENCOUNTER — Other Ambulatory Visit: Payer: Self-pay | Admitting: Obstetrics and Gynecology

## 2017-02-25 DIAGNOSIS — R928 Other abnormal and inconclusive findings on diagnostic imaging of breast: Secondary | ICD-10-CM

## 2017-03-02 ENCOUNTER — Encounter: Payer: Self-pay | Admitting: Behavioral Health

## 2017-03-02 ENCOUNTER — Ambulatory Visit
Admission: RE | Admit: 2017-03-02 | Discharge: 2017-03-02 | Disposition: A | Discharge status: Home / Self Care | Payer: 59 | Source: Ambulatory Visit | Attending: Obstetrics and Gynecology | Admitting: Obstetrics and Gynecology

## 2017-03-02 ENCOUNTER — Telehealth: Payer: Self-pay | Admitting: Behavioral Health

## 2017-03-02 DIAGNOSIS — R928 Other abnormal and inconclusive findings on diagnostic imaging of breast: Secondary | ICD-10-CM

## 2017-03-02 DIAGNOSIS — N6489 Other specified disorders of breast: Secondary | ICD-10-CM | POA: Diagnosis not present

## 2017-03-02 DIAGNOSIS — R922 Inconclusive mammogram: Secondary | ICD-10-CM | POA: Diagnosis not present

## 2017-03-02 NOTE — Telephone Encounter (Signed)
Pre-Visit Call completed with patient and chart updated.   Pre-Visit Info documented in Specialty Comments under Snap[employer]ot.    

## 2017-03-03 ENCOUNTER — Ambulatory Visit (INDEPENDENT_AMBULATORY_CARE_PROVIDER_SITE_OTHER): Payer: 59 | Admitting: Family Medicine

## 2017-03-03 VITALS — BP 120/72 | HR 65 | Temp 98.2°F | Ht 67.0 in | Wt 161.0 lb

## 2017-03-03 DIAGNOSIS — Z8669 Personal history of other diseases of the nervous system and sense organs: Secondary | ICD-10-CM | POA: Diagnosis not present

## 2017-03-03 DIAGNOSIS — E785 Hyperlipidemia, unspecified: Secondary | ICD-10-CM | POA: Diagnosis not present

## 2017-03-03 DIAGNOSIS — Z131 Encounter for screening for diabetes mellitus: Secondary | ICD-10-CM

## 2017-03-03 DIAGNOSIS — Z130 Encounter for screening for diseases of the blood and blood-forming organs and certain disorders involving the immune mechanism: Secondary | ICD-10-CM | POA: Diagnosis not present

## 2017-03-03 NOTE — Progress Notes (Signed)
[name], [name], [name] [phone]  Date:  03/03/2017   Name:  [name]   DOB:  1966-08-03   MRN:  [id number]  PCP:  Darreld Mclean, MD    Chief Complaint: Establish Care (Pt here to est care. )   History of Present Illness:  [name] is a 51 y.o. very pleasant female patient who presents with the following:  Here today as a Jillian patient She is generally in [facility]  She [facility] - her LDL was a bit high and she wanted to have it repeated for confirmation We will recheck this for her  She did eat a muffin today with egg and bacon so will do fasting labs another day  She did start taking krill oil in April in hopes of raising her HDL  Both of her parents have high cholesterol Her father had an MI at age 33 She has 3 children, 7, 67 and 67 yo.   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  This gives her more time to take care of her family.  She is on gabapentin and zonegran for her HA0- these medications do help with her migraines She gets her well woman care per her OBGYN She has a mirena IUD   There are no active problems to display for this patient.   Past Medical History:  Diagnosis Date  . Abnormal Pap smear of cervix 1996   Cryo  . Dysplasia of cervix 1996   cryo, no abn paps since  . Hypercholesteremia 2018  . Migraine    w/o aura  . Placenta accreta    Noted with last cesarean section 2007    Past Surgical History:  Procedure Laterality Date  . CESAREAN SECTION  03, 05, 07  . CRYOTHERAPY N/A 1996   cervix    Social History  Substance Use Topics  . Smoking status: Never Smoker  . Smokeless tobacco: Never Used  . Alcohol use No    Family History  Problem Relation Age of Onset  . Heart attack Father 47       while in [place], bypass  . Diabetes Father   .  Hypertension Mother   . Osteoporosis Mother   . Diabetes Maternal Grandfather        questionable  . Hypertension Maternal Grandmother   . Heart disease Maternal Grandmother        bypass  . Heart disease Paternal Grandfather     No Known Allergies  Medication list has been reviewed and updated.  Current Outpatient Prescriptions on File Prior to Visit  Medication Sig Dispense Refill  . gabapentin (NEURONTIN) 600 MG tablet Take 1 tablet by mouth 2 (two) times daily.  2  . Krill Oil 350 MG CAPS Take 1 tablet by mouth daily.    Marland Kitchen levonorgestrel (MIRENA) 20 MCG/24HR IUD 1 each by Intrauterine route once. Inserted 05/14/14    . naproxen (NAPROSYN) 500 MG tablet TAKE 1 TABLET AS NEEDED FOR HEADACHE UP TO TWICE A DAY . LIMIT USE TO 1 TO 2 DAYS/WK.  0  . SUMAtriptan (IMITREX) 100 MG tablet     . zonisamide (ZONEGRAN) 100 MG capsule Take 100 mg by mouth daily.      No current facility-administered medications on file prior to visit.  Review of Systems:  As per HPI- otherwise negative. No fever, chills, CP,SOB, rash, sore throat, cough, nausea, vomiting or diarrhea    Physical Examination: There were no vitals filed for this visit. Vitals:   03/03/17 1149  Weight: 161 lb (73 kg)  Height: 5\' 7"  (1.702 m)   Body mass index is 25.22 kg/m. Ideal Body Weight: Weight in (lb) to have BMI = 25: 159.3  GEN: WDWN, NAD, Non-toxic, A & O x 3, normal weight HEENT: Atraumatic, Normocephalic. Neck supple. No masses, No LAD. Ears and Nose: No external deformity. CV: RRR, No M/G/R. No JVD. No thrill. No extra heart sounds. PULM: CTA B, no wheezes, crackles, rhonchi. No retractions. No resp. distress. No accessory muscle use. EXTR: No c/c/e NEURO Normal gait.  PSYCH: Normally interactive. Conversant. Not depressed or anxious appearing.  Calm demeanor.  Looks well and healthy   Assessment and Plan: Dyslipidemia - Plan: Lipid panel  Screening for deficiency anemia - Plan: CBC  Encounter  for screening examination for intermediate hyperglycemia and diabetes mellitus - Plan: Comprehensive metabolic panel  Hx of migraine headaches  Here today to establish care She will come in for fasting labs soon and I will contact her to discuss her results and cholesterol medication if indicated   Signed Lamar Blinks, MD

## 2017-03-03 NOTE — Patient Instructions (Signed)
Please come and see [name] for fasting labs at your convenience. Take care and I will be in touch with your results- we can then discuss if we should start a cholesterol medication for you

## 2017-05-10 ENCOUNTER — Encounter: Payer: Self-pay | Admitting: Family Medicine

## 2017-05-10 ENCOUNTER — Other Ambulatory Visit (INDEPENDENT_AMBULATORY_CARE_PROVIDER_SITE_OTHER): Payer: 59

## 2017-05-10 DIAGNOSIS — Z130 Encounter for screening for diseases of the blood and blood-forming organs and certain disorders involving the immune mechanism: Secondary | ICD-10-CM | POA: Diagnosis not present

## 2017-05-10 DIAGNOSIS — E785 Hyperlipidemia, unspecified: Secondary | ICD-10-CM | POA: Diagnosis not present

## 2017-05-10 DIAGNOSIS — Z131 Encounter for screening for diabetes mellitus: Secondary | ICD-10-CM

## 2017-05-10 LAB — LIPID PANEL
CHOLESTEROL: 208 mg/dL — AB (ref 0–200)
HDL: 52.1 mg/dL (ref >39.00)
LDL CALC: 137 mg/dL — AB (ref 0–99)
NonHDL: 155.77
TRIGLYCERIDES: 95 mg/dL (ref 0.0–149.0)
Total CHOL/HDL Ratio: 4
VLDL: 19 mg/dL (ref 0.0–40.0)

## 2017-05-10 LAB — COMPREHENSIVE METABOLIC PANEL
ALBUMIN: 4.4 g/dL (ref 3.5–5.2)
ALK PHOS: 50 U/L (ref 39–117)
ALT: 20 U/L (ref 0–35)
AST: 14 U/L (ref 0–37)
BUN: 14 mg/dL (ref 6–23)
CO2: 31 mEq/L (ref 19–32)
Calcium: 9.9 mg/dL (ref 8.4–10.5)
Chloride: 105 mEq/L (ref 96–112)
Creatinine, Ser: 0.69 mg/dL (ref 0.40–1.20)
GFR: 95.95 mL/min (ref >60.00)
Glucose, Bld: 91 mg/dL (ref 70–99)
POTASSIUM: 5.1 meq/L (ref 3.5–5.1)
Sodium: 139 mEq/L (ref 135–145)
TOTAL PROTEIN: 7 g/dL (ref 6.0–8.3)
Total Bilirubin: 0.6 mg/dL (ref 0.2–1.2)

## 2017-05-10 LAB — CBC
HEMATOCRIT: 46.1 % — AB (ref 36.0–46.0)
HEMOGLOBIN: 15.1 g/dL — AB (ref 12.0–15.0)
MCHC: 32.7 g/dL (ref 30.0–36.0)
MCV: 97.7 fl (ref 78.0–100.0)
Platelets: 189 10*3/uL (ref 150.0–400.0)
RBC: 4.72 Mil/uL (ref 3.87–5.11)
RDW: 13 % (ref 11.5–15.5)
WBC: 5.2 10*3/uL (ref 4.0–10.5)

## 2017-07-17 IMAGING — MG 2D DIGITAL SCREENING BILATERAL MAMMOGRAM WITH CAD AND ADJUNCT TO
9 of 13 series · 9 of 29 positions shown · non-contrast
Comparison: Previous exam(s).

CLINICAL DATA: Screening.

EXAM:
2D DIGITAL SCREENING BILATERAL MAMMOGRAM WITH CAD AND ADJUNCT TOMO

[R CV]
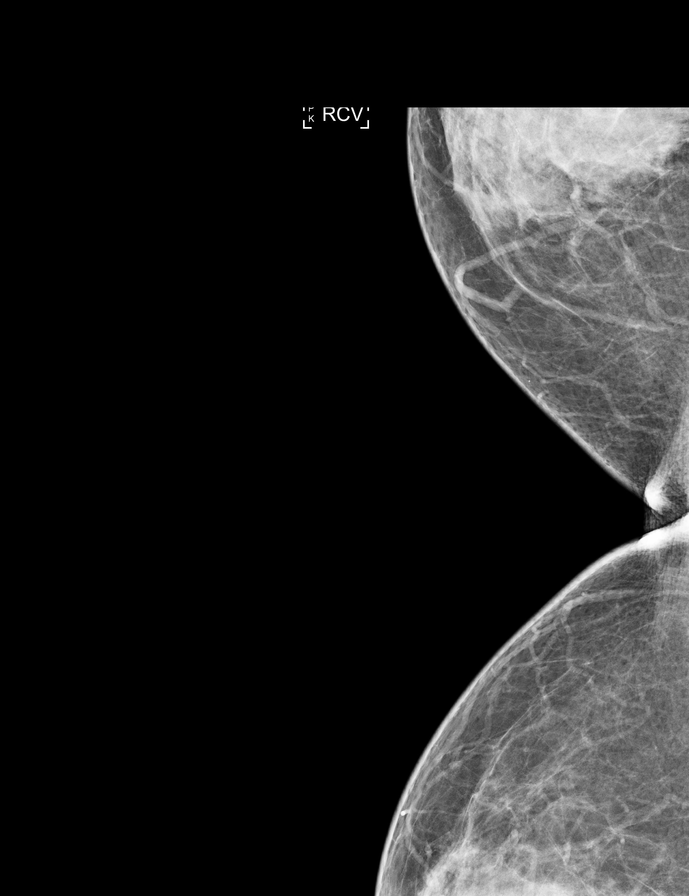

[R MLO synth-2D]
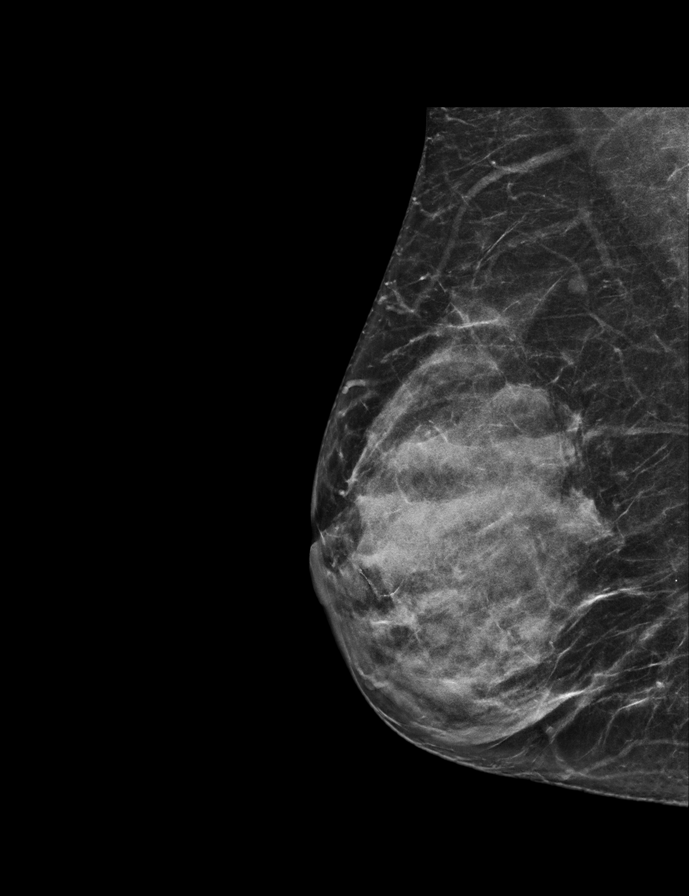

[L CC]
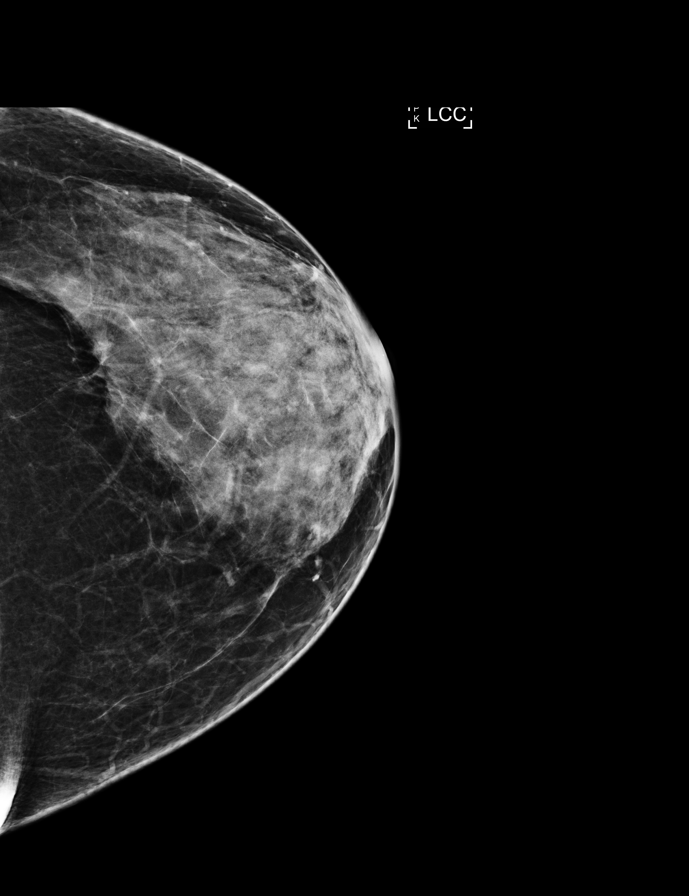

[R CC]
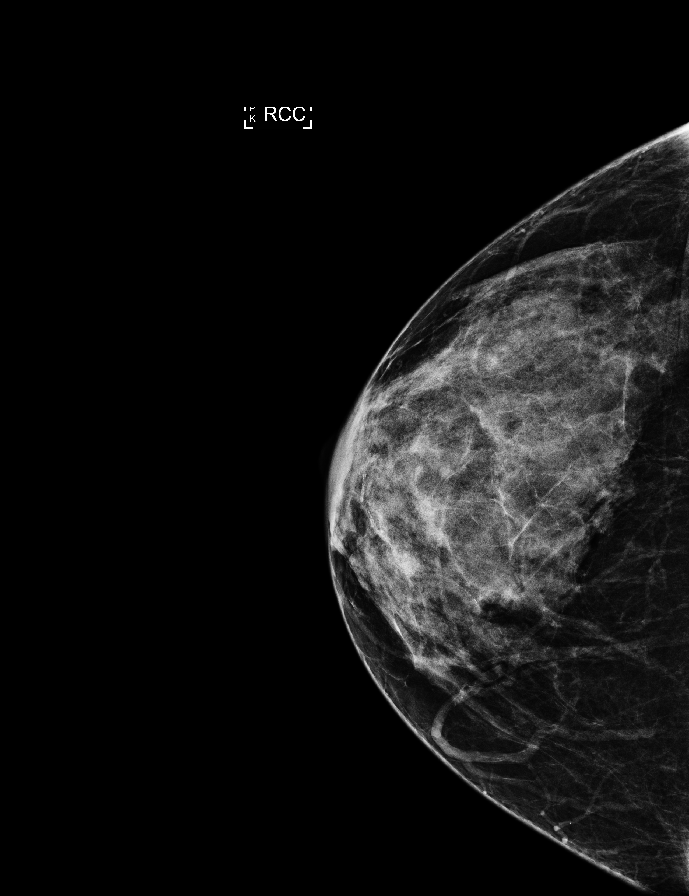

[L MLO synth-2D]
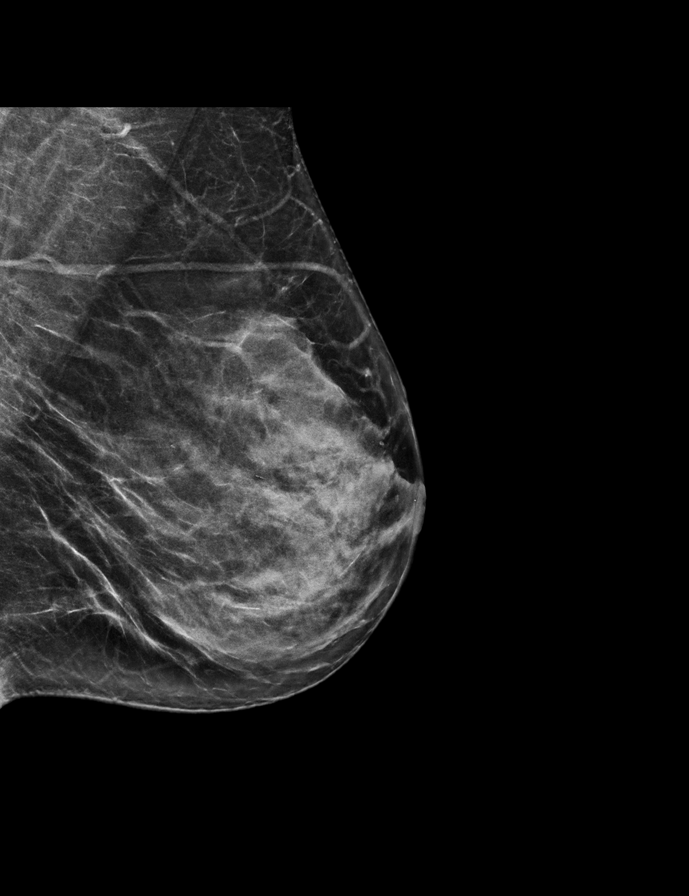

[R MLO]
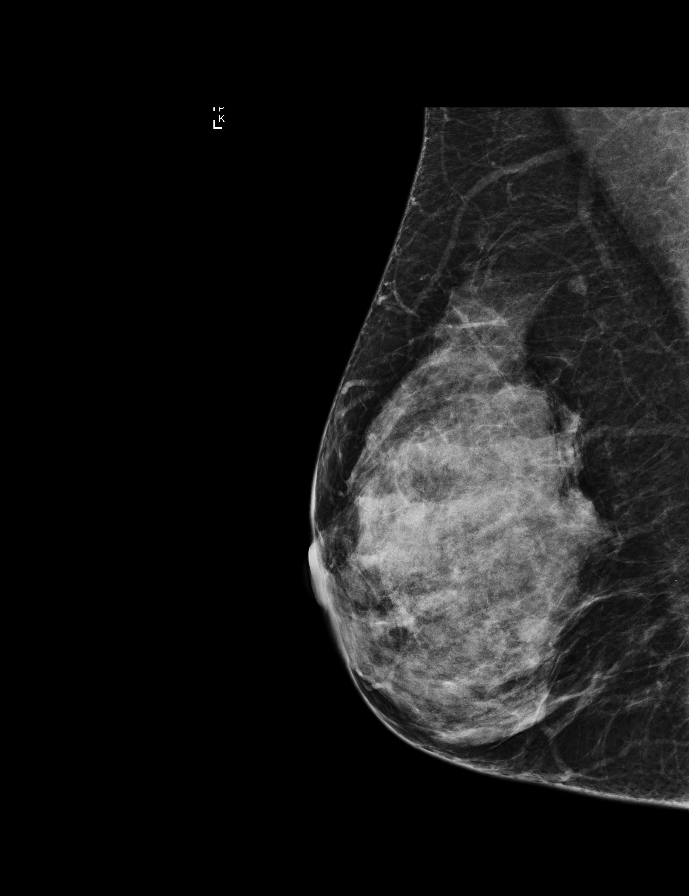

[R CC synth-2D]
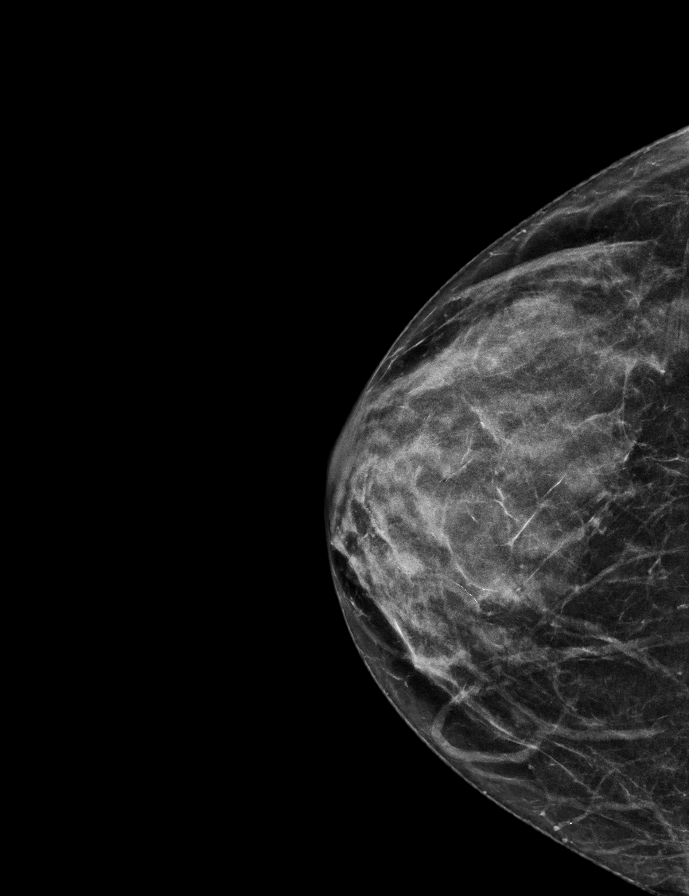

[L CC synth-2D]
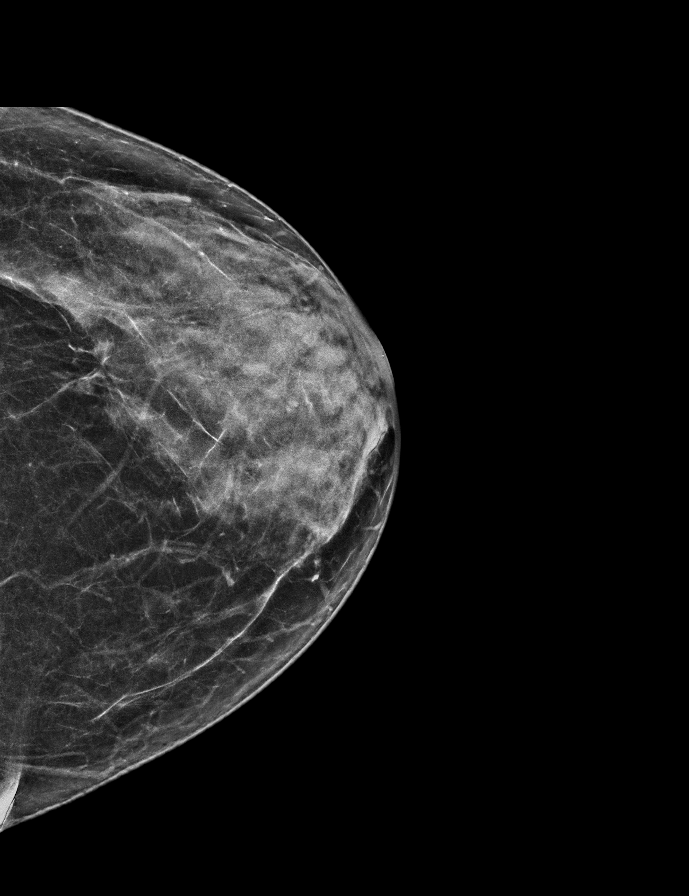

[L MLO]
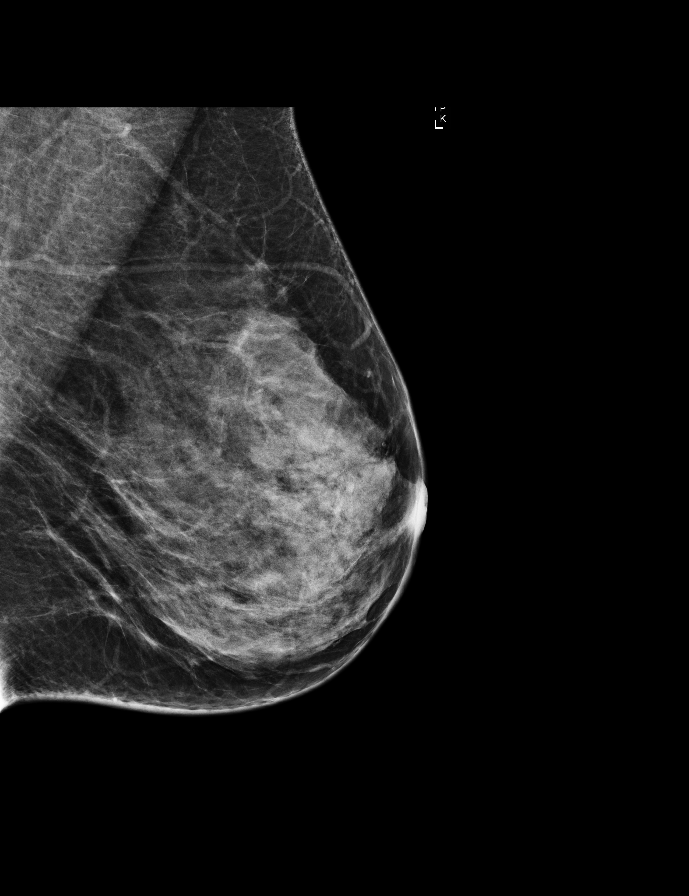

[9 of 29 positions shown; findings below may reference images not displayed]

ACR Breast Density Category c: The breast tissue is heterogeneously
dense, which may obscure small masses.
FINDINGS: In the right breast, a possible mass warrants further evaluation. In
the left breast, no findings suspicious for malignancy. Images were
processed with CAD.
IMPRESSION: Further evaluation is suggested for possible mass in the right
breast.

RECOMMENDATION:
Diagnostic mammogram and possibly ultrasound of the right breast.
(Code:[id number]-I-66A)

The patient will be contacted regarding the findings, and additional
imaging will be scheduled.

BI-RADS CATEGORY  0: Incomplete. Need additional imaging evaluation
and/or prior mammograms for comparison.

## 2017-08-26 DIAGNOSIS — G43019 Migraine without aura, intractable, without status migrainosus: Secondary | ICD-10-CM | POA: Diagnosis not present

## 2017-08-26 DIAGNOSIS — G43719 Chronic migraine without aura, intractable, without status migrainosus: Secondary | ICD-10-CM | POA: Diagnosis not present

## 2017-10-07 DIAGNOSIS — G43719 Chronic migraine without aura, intractable, without status migrainosus: Secondary | ICD-10-CM | POA: Diagnosis not present

## 2017-10-07 DIAGNOSIS — G43019 Migraine without aura, intractable, without status migrainosus: Secondary | ICD-10-CM | POA: Diagnosis not present

## 2017-11-18 DIAGNOSIS — G43019 Migraine without aura, intractable, without status migrainosus: Secondary | ICD-10-CM | POA: Diagnosis not present

## 2017-11-18 DIAGNOSIS — G43719 Chronic migraine without aura, intractable, without status migrainosus: Secondary | ICD-10-CM | POA: Diagnosis not present

## 2017-12-03 ENCOUNTER — Telehealth: Payer: Self-pay | Admitting: Obstetrics and Gynecology

## 2017-12-03 DIAGNOSIS — N3001 Acute cystitis with hematuria: Secondary | ICD-10-CM | POA: Diagnosis not present

## 2017-12-03 NOTE — Telephone Encounter (Signed)
Patient called with suspected UTI and would like to speak with nurse about having Dr. Quincy Simmonds call something in for her.

## 2017-12-03 NOTE — Telephone Encounter (Signed)
Left message to call Kaitlyn at [phone]. 

## 2017-12-03 NOTE — Telephone Encounter (Signed)
Spoke with patient. [facility]. States she had a UTI at the beginning of March and was treated with Macrobid. Was started on Keflex today and urine was sent for culture. Patient will take keflex and await results. Will return call with any questions or concerns.  Routing to provider for final review. Patient agreeable to disposition. Will close encounter.

## 2017-12-13 ENCOUNTER — Telehealth: Payer: Self-pay | Admitting: Obstetrics and Gynecology

## 2017-12-13 NOTE — Telephone Encounter (Signed)
Patient called requesting a call back from the nurse. She has general questions about "recurrent UTI's." Patient declined an appointment.

## 2017-12-13 NOTE — Telephone Encounter (Signed)
Spoke with patient. Patient reports [facility] and again 3/22. Patient last treated with Keflex for "positive urine culture, was advised this was the correct abx, symptoms resolved". Patient states she was provided with a prn Macrobid Rx because she was traveling.   Reports cloudy urine in the mornings, no odor, denies any other symptoms, asking if Macrobid should be taken? "Drinks a lot of fluids during day".   Recommended OV for further evaluation, OV scheduled for 4/4 at 12pm with Dr. Quincy Simmonds, patient declined earlier appts offered. Advised to return call for earlier OV if new symptoms develop. Advised will contact [facility] for copy of UC results to be faxed to [facility], patient verbalizes understanding.

## 2017-12-13 NOTE — Telephone Encounter (Signed)
Per review of Epic, [facility] visit dated 11/13/17 scanned into Epic.   Call placed to [facility], spoke with Toby. Requested copy of [facility] clinical visit dated 12/03/17 be faxed to [phone].

## 2017-12-13 NOTE — Telephone Encounter (Signed)
Results received, placed on Dr. Elza Rafter desk for review.   Routing to provider for final review. Patient is agreeable to disposition. Will close encounter.

## 2017-12-16 ENCOUNTER — Ambulatory Visit: Payer: Self-pay | Admitting: Obstetrics and Gynecology

## 2017-12-20 ENCOUNTER — Other Ambulatory Visit: Payer: Self-pay

## 2017-12-20 ENCOUNTER — Encounter: Payer: Self-pay | Admitting: Obstetrics and Gynecology

## 2017-12-20 ENCOUNTER — Ambulatory Visit (INDEPENDENT_AMBULATORY_CARE_PROVIDER_SITE_OTHER): Payer: 59 | Admitting: Obstetrics and Gynecology

## 2017-12-20 VITALS — BP 122/70 | HR 64 | Ht 66.25 in | Wt 165.0 lb

## 2017-12-20 DIAGNOSIS — R350 Frequency of micturition: Secondary | ICD-10-CM | POA: Diagnosis not present

## 2017-12-20 DIAGNOSIS — Z87440 Personal history of urinary (tract) infections: Secondary | ICD-10-CM | POA: Diagnosis not present

## 2017-12-20 LAB — POCT URINALYSIS DIPSTICK
BILIRUBIN UA: NEGATIVE
Blood, UA: NEGATIVE
Glucose, UA: NEGATIVE
KETONES UA: NEGATIVE
Leukocytes, UA: NEGATIVE
NITRITE UA: NEGATIVE
PROTEIN UA: NEGATIVE
UROBILINOGEN UA: 0.2 U/dL
pH, UA: 5 (ref 5.0–8.0)

## 2017-12-20 NOTE — Progress Notes (Signed)
GYNECOLOGY  VISIT   HPI: Jillian y.o.   Married  Caucasian  female   [phone] with No LMP recorded. (Menstrual status: IUD).   here for possible UTI and urinary frequency. She has been treated for 2 UTIs in past 4-6 weeks.   Last month had a UTI and 2 weeks later had it again.  Went to [facility].  For first episode, had tx with Macrobid and no culture done.  For second episode, had tx with Keflex, and culture was sent.  Not sure of organism that grew.   No increased sexual activity.  Voiding often but not having urgency.  Has cloudy urine.  No dysuria.  No hematuria.   No vaginal itching, burning, or irritation.  Did take Diflucan with second round of abx for UTI.  Has maybe one UTI per year.   Has skipped menses for 3 months with Mirena.  Had one day of bleeding Jan., Feb, and March.  Taking Aimovig for migraine HAs.  Urine Dip:Neg  GYNECOLOGIC HISTORY: No LMP recorded. (Menstrual status: IUD). Contraception:  Mirena IUD 05-14-14 Menopausal hormone therapy:  n/a Last mammogram: 02-24-17  Neg/C/BiRads1 Last pap smear: 11-14-14 Neg:Neg HR HPV         10-18-13 Neg:Neg HR HPV         OB History    Gravida  5   Para  3   Term  2   Preterm  1   AB  2   Living  3     SAB  2   TAB      Ectopic      Multiple      Live Births  3              Patient Active Problem List   Diagnosis Date Noted  . Hx of migraine headaches 03/03/2017  . Dyslipidemia 03/03/2017    Past Medical History:  Diagnosis Date  . Abnormal Pap smear of cervix 1996   Cryo  . Dysplasia of cervix 1996   cryo, no abn paps since  . Hypercholesteremia 2018  . Migraine    w/o aura  . Placenta accreta    Noted with last cesarean section 2007    Past Surgical History:  Procedure Laterality Date  . CESAREAN SECTION  03, 05, 07  . CRYOTHERAPY N/A 1996   cervix    Current Outpatient Medications  Medication Sig Dispense Refill  . AIMOVIG 70 MG/ML SOAJ 70 mg every 30 (thirty) days.   2  . Krill Oil 350 MG CAPS Take 1 tablet by mouth daily.    Marland Kitchen levonorgestrel (MIRENA) 20 MCG/24HR IUD 1 each by Intrauterine route once. Inserted 05/14/14    . naproxen (NAPROSYN) 500 MG tablet TAKE 1 TABLET AS NEEDED FOR HEADACHE UP TO TWICE A DAY . LIMIT USE TO 1 TO 2 DAYS/WK.  0  . SUMAtriptan (IMITREX) 100 MG tablet     . zonisamide (ZONEGRAN) 100 MG capsule Take 100 mg by mouth daily.      No current facility-administered medications for this visit.      ALLERGIES: Patient has no known allergies.  Family History  Problem Relation Age of Onset  . Heart attack Father 21       while in [place], bypass  . Diabetes Father   . Hypertension Mother   . Osteoporosis Mother   . Diabetes Maternal Grandfather        questionable  . Hypertension Maternal Grandmother   .  Heart disease Maternal Grandmother        bypass  . Heart disease Paternal Grandfather     Social History   Socioeconomic History  . Marital status: Married    Spouse name: Not on file  . Number of children: Not on file  . Years of education: Not on file  . Highest education level: Not on file  Occupational History  . Not on file  Social Needs  . Financial resource strain: Not on file  . Food insecurity:    Worry: Not on file    Inability: Not on file  . Transportation needs:    Medical: Not on file    Non-medical: Not on file  Tobacco Use  . Smoking status: Never Smoker  . Smokeless tobacco: Never Used  Substance and Sexual Activity  . Alcohol use: No    Alcohol/week: 0.0 oz  . Drug use: No  . Sexual activity: Yes    Partners: Male    Birth control/protection: Surgical, IUD    Comment: vasectomy/ Mirena Inserted 05/14/14  Lifestyle  . Physical activity:    Days per week: Not on file    Minutes per session: Not on file  . Stress: Not on file  Relationships  . Social connections:    Talks on phone: Not on file    Gets together: Not on file    Attends religious service: Not on file    Active  member of club or organization: Not on file    Attends meetings of clubs or organizations: Not on file    Relationship status: Not on file  . Intimate partner violence:    Fear of current or ex partner: Not on file    Emotionally abused: Not on file    Physically abused: Not on file    Forced sexual activity: Not on file  Other Topics Concern  . Not on file  Social History Narrative  . Not on file    ROS:  Pertinent items are noted in HPI.  PHYSICAL EXAMINATION:    BP 122/70 (BP Location: Right Arm, Patient Position: Sitting, Cuff Size: Normal)   Pulse 64   Ht 5' 6.25" (1.683 m)   Wt 165 lb (74.8 kg)   BMI 26.43 kg/m     General appearance: alert, cooperative and appears stated age   ASSESSMENT  Urinary frequency.   Hx UTI. No evidence of infection today. Mirena IUD patient.   PLAN  Discussed increasing water intake. May consider local vaginal estrogen if has further infections.  No abx prophylaxis needed.  Discussed AZO standard for urinary pain until able to be seen for an office visit for UTI symptoms. Return for annual exam and prn.   An After Visit Summary was printed and given to the patient.  _15_____ minutes face to face time of which over Jillian% was spent in counseling.

## 2018-01-20 DIAGNOSIS — L821 Other seborrheic keratosis: Secondary | ICD-10-CM | POA: Diagnosis not present

## 2018-01-20 DIAGNOSIS — D2272 Melanocytic nevi of left lower limb, including hip: Secondary | ICD-10-CM | POA: Diagnosis not present

## 2018-01-20 DIAGNOSIS — D224 Melanocytic nevi of scalp and neck: Secondary | ICD-10-CM | POA: Diagnosis not present

## 2018-01-31 ENCOUNTER — Other Ambulatory Visit: Payer: Self-pay

## 2018-01-31 ENCOUNTER — Other Ambulatory Visit (HOSPITAL_COMMUNITY)
Admission: RE | Admit: 2018-01-31 | Discharge: 2018-01-31 | Disposition: A | Discharge status: Home / Self Care | Payer: 59 | Source: Ambulatory Visit | Attending: Obstetrics and Gynecology | Admitting: Obstetrics and Gynecology

## 2018-01-31 ENCOUNTER — Encounter: Payer: Self-pay | Admitting: Obstetrics and Gynecology

## 2018-01-31 ENCOUNTER — Ambulatory Visit (INDEPENDENT_AMBULATORY_CARE_PROVIDER_SITE_OTHER): Payer: 59 | Admitting: Obstetrics and Gynecology

## 2018-01-31 VITALS — BP 112/60 | HR 64 | Resp 14 | Ht 65.75 in | Wt 163.0 lb

## 2018-01-31 DIAGNOSIS — Z01419 Encounter for gynecological examination (general) (routine) without abnormal findings: Secondary | ICD-10-CM

## 2018-01-31 DIAGNOSIS — Z1211 Encounter for screening for malignant neoplasm of colon: Secondary | ICD-10-CM | POA: Diagnosis not present

## 2018-01-31 NOTE — Progress Notes (Signed)
52 y.o. [id number] Married Caucasian female here for annual exam.    Had a couple of UTIs this year.   Feels more warm in general. States she is always hot usually.  Does not desire any hormonal testing.   Under her left breast she feels a bruised feeling under the breast.  Feels a pushing feeling.  Aggrevated with doing sit ups one day.  No new activity.  Wants to be very thoughtful about ordering testing to evaluate this.  Rare use of Prilosec twice a year.   Last time she had spotting was one month ago.  This was the first time in a few months.   Stopped Neurontin for her headaches.  She had difficulty staying awake.   PCP: Dr. Edilia Bo   Patient's last menstrual period was 11/29/2017.           Sexually active: Yes.    The current method of family planning is vasectomy and Mirena IUD inserted 05/14/14.    Exercising: Yes.    walking Smoker:  no  Health Maintenance: Pap:  11-14-14 Neg:Neg HR HPV         10-18-13 Neg:Neg HR HPV History of abnormal Pap:  Yes, 1996 Hx of cryotherapy to cervix. Paps normal since MMG:  02/24/17 BIRADS 0:Incomplete; 03/02/17 MM/US of Right Breast - BIRADS 1 negative/density c Colonoscopy:  never TDaP:  10/18/13 Gardasil:   n/a HIV: negative in pregnancy Hep C: never.  NA. Screening Labs:  PCP   reports that she has never smoked. She has never used smokeless tobacco. She reports that she does not drink alcohol or use drugs.  Past Medical History:  Diagnosis Date  . Abnormal Pap smear of cervix 1996   Cryo  . Dysplasia of cervix 1996   cryo, no abn paps since  . Hypercholesteremia 2018  . Migraine    w/o aura  . Placenta accreta    Noted with last cesarean section 2007    Past Surgical History:  Procedure Laterality Date  . CESAREAN SECTION  03, 05, 07  . CRYOTHERAPY N/A 1996   cervix    Current Outpatient Medications  Medication Sig Dispense Refill  . AIMOVIG 70 MG/ML SOAJ 70 mg every 30 (thirty) days.  2  . Krill Oil 350 MG CAPS  Take 1 tablet by mouth daily.    Marland Kitchen levonorgestrel (MIRENA) 20 MCG/24HR IUD 1 each by Intrauterine route once. Inserted 05/14/14    . naproxen (NAPROSYN) 500 MG tablet TAKE 1 TABLET AS NEEDED FOR HEADACHE UP TO TWICE A DAY . LIMIT USE TO 1 TO 2 DAYS/WK.  0  . SUMAtriptan (IMITREX) 100 MG tablet     . zonisamide (ZONEGRAN) 100 MG capsule Take 100 mg by mouth daily.      No current facility-administered medications for this visit.     Family History  Problem Relation Age of Onset  . Heart attack Father 10       while in [place], bypass  . Diabetes Father   . Hypertension Mother   . Osteoporosis Mother   . Diabetes Maternal Grandfather        questionable  . Hypertension Maternal Grandmother   . Heart disease Maternal Grandmother        bypass  . Heart disease Paternal Grandfather     Review of Systems  Constitutional: Negative.   HENT: Negative.   Eyes: Negative.   Respiratory: Negative.   Cardiovascular: Negative.   Gastrointestinal: Negative.   Endocrine: Negative.  Genitourinary: Negative.   Musculoskeletal: Negative.   Skin: Negative.   Allergic/Immunologic: Negative.   Neurological: Negative.   Hematological: Negative.   Psychiatric/Behavioral: Negative.     Exam:   BP 112/60 (BP Location: Right Arm, Patient Position: Sitting, Cuff Size: Normal)   Pulse 64   Resp 14   Ht 5' 5.75" (1.67 m)   Wt 163 lb (73.9 kg)   LMP 11/29/2017   BMI 26.51 kg/m     General appearance: alert, cooperative and appears stated age Head: Normocephalic, without obvious abnormality, atraumatic Neck: no adenopathy, supple, symmetrical, trachea midline and thyroid normal to inspection and palpation Lungs: clear to auscultation bilaterally Breasts: normal appearance, no masses or tenderness, No nipple retraction or dimpling, No nipple discharge or bleeding, No axillary or supraclavicular adenopathy Heart: regular rate and rhythm Abdomen: soft, non-tender; no masses, no  organomegaly Extremities: extremities normal, atraumatic, no cyanosis or edema Skin: Skin color, texture, turgor normal. No rashes or lesions Lymph nodes: Cervical, supraclavicular, and axillary nodes normal. No abnormal inguinal nodes palpated Neurologic: Grossly normal  Pelvic: External genitalia:  no lesions              Urethra:  normal appearing urethra with no masses, tenderness or lesions              Bartholins and Skenes: normal                 Vagina: normal appearing vagina with normal color and discharge, no lesions              Cervix: no lesions.  IUD strings not seen (IUD position confirmed by previous US.)              Pap taken: Yes.   Bimanual Exam:  Uterus:  normal size, contour, position, consistency, mobility, non-tender              Adnexa: no mass, fullness, tenderness              Rectal exam: Yes.  .  Confirms.              Anus:  normal sphincter tone, no lesions  Chaperone was present for exam.  Assessment:   Well woman visit with normal exam. Mirena IUD patient.  Likely peromenpausal.  Remote history of cryotherapy to cervix.  Left rib pain.  Costochondritis?  Plan: Mammogram screening. Recommended self breast awareness. Pap and HR HPV as above. Guidelines for Calcium, Vitamin D, regular exercise program including cardiovascular and weight bearing exercise. Referral for colonoscopy.  Labs with PCP. I discussed signs and symptoms of menopause and tx options of HRT, SSRIs.  Brochure on menopause.  I would try a short course of NSAID of choice and then follow up with PCP if rib pain does not resolve.  May benefit from a CXR. Follow up annually and prn.   After visit summary provided.

## 2018-01-31 NOTE — Patient Instructions (Signed)

## 2018-02-02 LAB — CYTOLOGY - PAP
Diagnosis: NEGATIVE
HPV (WINDOPATH): NOT DETECTED

## 2018-04-05 DIAGNOSIS — G43019 Migraine without aura, intractable, without status migrainosus: Secondary | ICD-10-CM | POA: Diagnosis not present

## 2018-04-05 DIAGNOSIS — G43719 Chronic migraine without aura, intractable, without status migrainosus: Secondary | ICD-10-CM | POA: Diagnosis not present

## 2018-04-25 ENCOUNTER — Other Ambulatory Visit: Payer: Self-pay | Admitting: Obstetrics and Gynecology

## 2018-04-25 DIAGNOSIS — Z1231 Encounter for screening mammogram for malignant neoplasm of breast: Secondary | ICD-10-CM

## 2018-05-03 ENCOUNTER — Encounter: Payer: Self-pay | Admitting: Gastroenterology

## 2018-05-26 ENCOUNTER — Ambulatory Visit
Admission: RE | Admit: 2018-05-26 | Discharge: 2018-05-26 | Disposition: A | Discharge status: Home / Self Care | Payer: 59 | Source: Ambulatory Visit | Attending: Obstetrics and Gynecology | Admitting: Obstetrics and Gynecology

## 2018-05-26 DIAGNOSIS — Z1231 Encounter for screening mammogram for malignant neoplasm of breast: Secondary | ICD-10-CM | POA: Diagnosis not present

## 2018-06-30 ENCOUNTER — Encounter: Payer: Self-pay | Admitting: Gastroenterology

## 2018-06-30 ENCOUNTER — Ambulatory Visit (AMBULATORY_SURGERY_CENTER): Payer: Self-pay

## 2018-06-30 VITALS — Ht 67.0 in | Wt 158.2 lb

## 2018-06-30 DIAGNOSIS — Z1211 Encounter for screening for malignant neoplasm of colon: Secondary | ICD-10-CM

## 2018-06-30 MED ORDER — NA SULFATE-K SULFATE-MG SULF 17.5-3.13-1.6 GM/177ML PO SOLN: 1.0000 | Freq: Once | ORAL | 0 refills | Status: AC

## 2018-06-30 NOTE — Progress Notes (Signed)
Denies allergies to eggs or soy products. Denies complication of anesthesia or sedation. Denies use of weight loss medication. Denies use of O2.   Emmi instructions declined.   A 15.00 coupon for Suprep was given to the patient.  

## 2018-07-06 DIAGNOSIS — G43019 Migraine without aura, intractable, without status migrainosus: Secondary | ICD-10-CM | POA: Diagnosis not present

## 2018-07-06 DIAGNOSIS — G43719 Chronic migraine without aura, intractable, without status migrainosus: Secondary | ICD-10-CM | POA: Diagnosis not present

## 2018-07-14 ENCOUNTER — Encounter: Payer: Self-pay | Admitting: Gastroenterology

## 2018-07-14 ENCOUNTER — Ambulatory Visit (AMBULATORY_SURGERY_CENTER): Payer: 59 | Admitting: Gastroenterology

## 2018-07-14 VITALS — BP 123/72 | HR 65 | Temp 98.0°F | Resp 21 | Ht 65.0 in | Wt 163.0 lb

## 2018-07-14 DIAGNOSIS — Z1211 Encounter for screening for malignant neoplasm of colon: Secondary | ICD-10-CM

## 2018-07-14 MED ORDER — SODIUM CHLORIDE 0.9 % IV SOLN: 500.0000 mL | Freq: Once | INTRAVENOUS | Status: DC

## 2018-07-14 NOTE — Progress Notes (Signed)
A and O x3. Report to RN. Tolerated MAC anesthesia well.

## 2018-07-14 NOTE — Patient Instructions (Signed)
YOU HAD AN ENDOSCOPIC PROCEDURE TODAY AT [facility]:   Refer to the procedure report that was given to you for any specific questions about what was found during the examination.  If the procedure report does not answer your questions, please call your gastroenterologist to clarify.  If you requested that your care partner not be given the details of your procedure findings, then the procedure report has been included in a sealed envelope for you to review at your convenience later.  YOU SHOULD EXPECT: Some feelings of bloating in the abdomen. Passage of more gas than usual.  Walking can help get rid of the air that was put into your GI tract during the procedure and reduce the bloating. If you had a lower endoscopy (such as a colonoscopy or flexible sigmoidoscopy) you may notice spotting of blood in your stool or on the toilet paper. If you underwent a bowel prep for your procedure, you may not have a normal bowel movement for a few days.  Please Note:  You might notice some irritation and congestion in your nose or some drainage.  This is from the oxygen used during your procedure.  There is no need for concern and it should clear up in a day or so.  SYMPTOMS TO REPORT IMMEDIATELY:   Following lower endoscopy (colonoscopy or flexible sigmoidoscopy):  Excessive amounts of blood in the stool  Significant tenderness or worsening of abdominal pains  Swelling of the abdomen that is new, acute  Fever of 100F or higher   For urgent or emergent issues, a gastroenterologist can be reached at any hour by calling [phone].   DIET:  We do recommend a small meal at first, but then you may proceed to your regular diet.  Drink plenty of fluids but you should avoid alcoholic beverages for 24 hours.  ACTIVITY:  You should plan to take it easy for the rest of today and you should NOT DRIVE or use heavy machinery until tomorrow (because of the sedation medicines used during the test).     FOLLOW UP: Our staff will call the number listed on your records the next business day following your procedure to check on you and address any questions or concerns that you may have regarding the information given to you following your procedure. If we do not reach you, we will leave a message.  However, if you are feeling well and you are not experiencing any problems, there is no need to return our call.  We will assume that you have returned to your regular daily activities without incident.   SIGNATURES/CONFIDENTIALITY: You and/or your care partner have signed paperwork which will be entered into your electronic medical record.  These signatures attest to the fact that that the information above on your After Visit Summary has been reviewed and is understood.  Full responsibility of the confidentiality of this discharge information lies with you and/or your care-partner.

## 2018-07-14 NOTE — Progress Notes (Signed)
Pt's states no medical or surgical changes since previsit or office visit. 

## 2018-07-14 NOTE — Op Note (Signed)
[facility] Patient Name: [name] Procedure Date: 07/14/2018 8:59 AM MRN: [id number] Endoscopist: Mauri Pole , MD Age: 52 Referring MD:  Date of Birth: 1966/05/26 Gender: Female Account #: [id number] Procedure:                Colonoscopy Indications:              Screening for malignant neoplasm in the rectum Medicines:                Monitored Anesthesia Care Procedure:                Pre-Anesthesia Assessment:                           - Prior to the procedure, a History and Physical                            was performed, and patient medications and                            allergies were reviewed. The patient's tolerance of                            previous anesthesia was also reviewed. The risks                            and benefits of the procedure and the sedation                            options and risks were discussed with the patient.                            All questions were answered, and informed consent                            was obtained. Prior Anticoagulants: The patient has                            taken no previous anticoagulant or antiplatelet                            agents. ASA Grade Assessment: II - A patient with                            mild systemic disease. After reviewing the risks                            and benefits, the patient was deemed in                            satisfactory condition to undergo the procedure.                           After obtaining informed consent, the colonoscope  was passed under direct vision. Throughout the                            procedure, the patient's blood pressure, pulse, and                            oxygen saturations were monitored continuously. The                            Colonoscope was introduced through the anus and                            advanced to the the cecum, identified by                            appendiceal orifice  and ileocecal valve. The                            colonoscopy was performed without difficulty. The                            patient tolerated the procedure well. The quality                            of the bowel preparation was excellent. The                            terminal ileum, ileocecal valve, appendiceal                            orifice, and rectum were photographed. Scope In: 9:05:00 AM Scope Out: 9:19:58 AM Scope Withdrawal Time: 0 hours 9 minutes 49 seconds  Total Procedure Duration: 0 hours 14 minutes 58 seconds  Findings:                 The perianal and digital rectal examinations were                            normal.                           Non-bleeding internal hemorrhoids were found during                            retroflexion. The hemorrhoids were small. Complications:            No immediate complications. Estimated Blood Loss:     Estimated blood loss was minimal. Impression:               - Non-bleeding internal hemorrhoids.                           - No specimens collected. Recommendation:           - Patient has a contact number available for  emergencies. The signs and symptoms of potential                            delayed complications were discussed with the                            patient. Return to normal activities tomorrow.                            Written discharge instructions were provided to the                            patient.                           - Resume previous diet.                           - Continue present medications.                           - Repeat colonoscopy in 10 years for screening                            purposes. Mauri Pole, MD 07/14/2018 9:30:32 AM This report has been signed electronically.

## 2018-07-15 ENCOUNTER — Telehealth: Payer: Self-pay

## 2018-07-15 NOTE — Telephone Encounter (Signed)
  Follow up Call-  Call back number 07/14/2018  Post procedure Call Back phone  # [phone]  Permission to leave phone message Yes  Some recent data might be hidden     Patient questions:  Do you have a fever, pain , or abdominal swelling? No. Pain Score  0 *  Have you tolerated food without any problems? Yes.    Have you been able to return to your normal activities? Yes.    Do you have any questions about your discharge instructions: Diet   No. Medications  No. Follow up visit  No.  Do you have questions or concerns about your Care? No.  Actions: * If pain score is 4 or above: No action needed, pain <4.

## 2018-09-06 DIAGNOSIS — N390 Urinary tract infection, site not specified: Secondary | ICD-10-CM | POA: Diagnosis not present

## 2018-09-06 DIAGNOSIS — R319 Hematuria, unspecified: Secondary | ICD-10-CM | POA: Diagnosis not present

## 2018-10-10 ENCOUNTER — Encounter: Payer: Self-pay | Admitting: Obstetrics and Gynecology

## 2018-10-10 ENCOUNTER — Telehealth: Payer: Self-pay | Admitting: Obstetrics and Gynecology

## 2018-10-10 NOTE — Telephone Encounter (Signed)
Dr. Quincy Simmonds,  Please advise.  Patient sent request through mychart.  Needs consult?

## 2018-10-10 NOTE — Telephone Encounter (Signed)
Please have her make a brief appointment to see me.

## 2018-10-10 NOTE — Telephone Encounter (Signed)
Patient sent the following correspondence through [facility]. Routing to triage to assist patient with request.  Hi Dr. Quincy Simmonds,    [facility] you are well. I was wondering if I could get a prescription for Bactrim DS to have on hand when my UTI's occur.? I would appreciate it tremendously.  It seems the last couple of times when they have occurred it has been on the weekend or a holiday. The last one occurred on Christmas Eve and I had to make plans to go to the [facility] in between festivities. They did a urinalysis and culture. I just wanted to be prepared and have some relief next time. Of course if symptoms did not go away with the Bactrim DS I would call but this would give me relief. The only symptom I have ever had with these is the urgency discomfort.     Have a great day!  [employer]

## 2018-10-11 NOTE — Telephone Encounter (Signed)
Spoke with patient and consult scheduled.  Encounter closed.

## 2018-10-13 ENCOUNTER — Ambulatory Visit (INDEPENDENT_AMBULATORY_CARE_PROVIDER_SITE_OTHER): Payer: 59 | Admitting: Obstetrics and Gynecology

## 2018-10-13 ENCOUNTER — Other Ambulatory Visit: Payer: Self-pay

## 2018-10-13 ENCOUNTER — Encounter: Payer: Self-pay | Admitting: Obstetrics and Gynecology

## 2018-10-13 VITALS — BP 114/62 | HR 76 | Ht 65.75 in | Wt 158.0 lb

## 2018-10-13 DIAGNOSIS — R102 Pelvic and perineal pain: Secondary | ICD-10-CM | POA: Diagnosis not present

## 2018-10-13 DIAGNOSIS — N390 Urinary tract infection, site not specified: Secondary | ICD-10-CM | POA: Diagnosis not present

## 2018-10-13 LAB — POCT URINALYSIS DIPSTICK
BILIRUBIN UA: NEGATIVE
Blood, UA: NEGATIVE
Glucose, UA: NEGATIVE
KETONES UA: POSITIVE
Leukocytes, UA: NEGATIVE
NITRITE UA: NEGATIVE
PROTEIN UA: NEGATIVE
UROBILINOGEN UA: 0.2 U/dL
pH, UA: 5 (ref 5.0–8.0)

## 2018-10-13 MED ORDER — NITROFURANTOIN MONOHYD MACRO 100 MG PO CAPS: ORAL_CAPSULE | ORAL | 2 refills | Status: DC

## 2018-10-13 NOTE — Progress Notes (Signed)
GYNECOLOGY  VISIT   HPI: 53 y.o.   Married  Caucasian  female   [phone] with Patient's last menstrual period was 09/07/2018 (exact date).   here for consult regarding UTI medication.   Having UTIs and hoping for prescription medication.  Last occurred at Christmas time and was distressed by this occurring at a holiday. Took Bactrim DS and did well.  Used Macrobid in the past and did not have complete treatment of this.  This week she had some sensation like some urgency or UTI developing.  Never has pain with her UTI.  Is experiencing some cramping.  Has Mirena IUD, and her cramping like this is usually only for one day.   Not sure if infections are post coital UTI.   Some vaginal dryness.   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  GYNECOLOGIC HISTORY: Patient's last menstrual period was 09/07/2018 (exact date). Contraception: Vasectomy/Mirena IUD 05-14-14  Menopausal hormone therapy: none Last mammogram: 05-26-18 3D Neg/density C/BiRads1 Last pap smear: 01-31-18 Neg:Neg HR HPV, 11-14-14 Neg:Neg HR HPV        OB History    Gravida  5   Para  3   Term  2   Preterm  1   AB  2   Living  3     SAB  2   TAB      Ectopic      Multiple      Live Births  3              Patient Active Problem List   Diagnosis Date Noted  . Hx of migraine headaches 03/03/2017  . Dyslipidemia 03/03/2017    Past Medical History:  Diagnosis Date  . Abnormal Pap smear of cervix 1996   Cryo  . Dysplasia of cervix 1996   cryo, no abn paps since  . Hypercholesteremia 2018   Patient denies  . Migraine    w/o aura  . Placenta accreta    Noted with last cesarean section 2007    Past Surgical History:  Procedure Laterality Date  . CESAREAN SECTION  03, 05, 07  . CRYOTHERAPY N/A 1996   cervix  . DILATION AND CURETTAGE OF UTERUS     times 2    Current Outpatient Medications  Medication Sig Dispense Refill  . AIMOVIG 70 MG/ML SOAJ 70 mg every 30  (thirty) days.  2  . levonorgestrel (MIRENA) 20 MCG/24HR IUD 1 each by Intrauterine route once. Inserted 05/14/14    . naproxen (NAPROSYN) 500 MG tablet TAKE 1 TABLET AS NEEDED FOR HEADACHE UP TO TWICE A DAY . LIMIT USE TO 1 TO 2 DAYS/WK.  0  . SUMAtriptan (IMITREX) 100 MG tablet      No current facility-administered medications for this visit.      ALLERGIES: Patient has no known allergies.  Family History  Problem Relation Age of Onset  . Heart attack Father 38       while in [place], bypass  . Diabetes Father   . Hypertension Mother   . Osteoporosis Mother   . Diabetes Maternal Grandfather        questionable  . Hypertension Maternal Grandmother   . Heart disease Maternal Grandmother        bypass  . Heart disease Paternal Grandfather   . Colon cancer Neg Hx   . Esophageal cancer Neg Hx   . Rectal cancer Neg Hx   . Stomach cancer Neg Hx  Social History   Socioeconomic History  . Marital status: Married    Spouse name: Not on file  . Number of children: Not on file  . Years of education: Not on file  . Highest education level: Not on file  Occupational History  . Not on file  Social Needs  . Financial resource strain: Not on file  . Food insecurity:    Worry: Not on file    Inability: Not on file  . Transportation needs:    Medical: Not on file    Non-medical: Not on file  Tobacco Use  . Smoking status: Never Smoker  . Smokeless tobacco: Never Used  Substance and Sexual Activity  . Alcohol use: No    Alcohol/week: 0.0 standard drinks  . Drug use: No  . Sexual activity: Yes    Partners: Male    Birth control/protection: Surgical, I.U.D.    Comment: vasectomy/ Mirena Inserted 05/14/14  Lifestyle  . Physical activity:    Days per week: Not on file    Minutes per session: Not on file  . Stress: Not on file  Relationships  . Social connections:    Talks on phone: Not on file    Gets together: Not on file    Attends religious service: Not on file     Active member of club or organization: Not on file    Attends meetings of clubs or organizations: Not on file    Relationship status: Not on file  . Intimate partner violence:    Fear of current or ex partner: Not on file    Emotionally abused: Not on file    Physically abused: Not on file    Forced sexual activity: Not on file  Other Topics Concern  . Not on file  Social History Narrative  . Not on file    Review of Systems  All other systems reviewed and are negative.   PHYSICAL EXAMINATION:    BP 114/62 (BP Location: Right Arm, Patient Position: Sitting, Cuff Size: Normal)   Pulse 76   Ht 5' 5.75" (1.67 m)   Wt 158 lb (71.7 kg)   LMP 09/07/2018 (Exact Date) Comment: heavy spotting  BMI 25.70 kg/m     General appearance: alert, cooperative and appears stated age   Pelvic: External genitalia:  no lesions              Urethra:  normal appearing urethra with no masses, tenderness or lesions              Bartholins and Skenes: normal                 Vagina: normal appearing vagina with normal color and discharge, no lesions              Cervix: no lesions                Bimanual Exam:  Uterus:  normal size, contour, position, consistency, mobility, non-tender              Adnexa: no mass, fullness, tenderness                 Chaperone was present for exam.  ASSESSMENT  Pelvic cramping.  No signs of atrophy.  UTIs.  Mirena IUD.  Unable to see strings.  Constipation with Aimovig.   PLAN  UC.  Macrobid 100 mg for UTI prophylaxis.  Ok to take twice daily for 5 days for UTI.  Probiotics.  Return  for continued cramping. IUD due for removal end of August.  If perimenopausal then, can continue with IUD for one additional year.    An After Visit Summary was printed and given to the patient.  __15____ minutes face to face time of which over 50% was spent in counseling.

## 2018-10-14 ENCOUNTER — Ambulatory Visit: Payer: Self-pay | Admitting: Obstetrics and Gynecology

## 2018-10-15 LAB — URINE CULTURE

## 2019-02-15 ENCOUNTER — Ambulatory Visit: Payer: 59 | Admitting: Obstetrics and Gynecology

## 2019-04-26 NOTE — Progress Notes (Signed)
53 y.o. [id number] Married Caucasian female here for annual exam.    Patient complaining of abdominal cramping x 1 month and urinary frequency. No pain with urination. Voiding at night but not every night. Patient also complaining of "staying hot all the time"--not hot flashes.  Had spotting for 2 days, 2 weeks ago.   She wants routine labs.   Took Imitrex this am for HA.   Father developing dementia.   Urine Dip: negative.   PCP: Lamar Blinks, MD    No LMP recorded. (Menstrual status: IUD).           Sexually active: Yes.    The current method of family planning is vasectomy/Mirena 05-14-14.    Exercising: Yes.    walking, pure bar Smoker:  no  Health Maintenance: Pap: 01-31-18 Neg:Neg HR HPV, 11-14-14 Neg:Neg HR HPV History of abnormal Pap:  Yes, 1996 Hx of cryotherapy to cervix MMG: 05-26-18 3D Neg/density c/BiRads1 Colonoscopy: 07-14-18 normal;next 10 years BMD:   n/a  Result  n/a TDaP:  10-18-13 Gardasil:   n/a HIV:neg in pregnancy Hep C:never Screening Labs:  ---   reports that she has never smoked. She has never used smokeless tobacco. She reports that she does not drink alcohol or use drugs.  Past Medical History:  Diagnosis Date  . Abnormal Pap smear of cervix 1996   Cryo  . Dysplasia of cervix 1996   cryo, no abn paps since  . Hypercholesteremia 2018   Patient denies  . Migraine    w/o aura  . Placenta accreta    Noted with last cesarean section 2007    Past Surgical History:  Procedure Laterality Date  . CESAREAN SECTION  03, 05, 07  . CRYOTHERAPY N/A 1996   cervix  . DILATION AND CURETTAGE OF UTERUS     times 2    Current Outpatient Medications  Medication Sig Dispense Refill  . AIMOVIG 70 MG/ML SOAJ 70 mg every 30 (thirty) days.  2  . KRILL OIL PO Take 1 tablet by mouth as needed.    Marland Kitchen levonorgestrel (MIRENA) 20 MCG/24HR IUD 1 each by Intrauterine route once. Inserted 05/14/14    . naproxen (NAPROSYN) 500 MG tablet TAKE 1 TABLET AS NEEDED FOR  HEADACHE UP TO TWICE A DAY . LIMIT USE TO 1 TO 2 DAYS/WK.  0  . nitrofurantoin, macrocrystal-monohydrate, (MACROBID) 100 MG capsule Take one capsule (100 mg) by mouth as needed for infection prevention.  Take one capsule by mouth twice a day for 5 days for urinary tract infection. 30 capsule 2  . SUMAtriptan (IMITREX) 100 MG tablet      No current facility-administered medications for this visit.     Family History  Problem Relation Age of Onset  . Heart attack Father 60       while in [place], bypass  . Diabetes Father   . Dementia Father   . Hypertension Mother   . Osteoporosis Mother   . Diabetes Maternal Grandfather        questionable  . Hypertension Maternal Grandmother   . Heart disease Maternal Grandmother        bypass  . Heart disease Paternal Grandfather   . Colon cancer Neg Hx   . Esophageal cancer Neg Hx   . Rectal cancer Neg Hx   . Stomach cancer Neg Hx     Review of Systems  Genitourinary: Positive for frequency.  All other systems reviewed and are negative.   Exam:  BP 138/84   Pulse 66   Temp (!) 97.4 F (36.3 C) (Temporal)   Resp 16   Ht 5\' 6"  (1.676 m)   Wt 169 lb 6.4 oz (76.8 kg)   BMI 27.34 kg/m     General appearance: alert, cooperative and appears stated age Head: normocephalic, without obvious abnormality, atraumatic Neck: no adenopathy, supple, symmetrical, trachea midline and thyroid normal to inspection and palpation Lungs: clear to auscultation bilaterally Breasts: normal appearance, no masses or tenderness, No nipple retraction or dimpling, No nipple discharge or bleeding, No axillary adenopathy Heart: regular rate and rhythm Abdomen: soft, non-tender; no masses, no organomegaly Extremities: extremities normal, atraumatic, no cyanosis or edema Skin: skin color, texture, turgor normal. No rashes or lesions Lymph nodes: cervical, supraclavicular, and axillary nodes normal. Neurologic: grossly normal  Pelvic: External genitalia:   no lesions              No abnormal inguinal nodes palpated.              Urethra:  normal appearing urethra with no masses, tenderness or lesions              Bartholins and Skenes: normal                 Vagina: normal appearing vagina with normal color and discharge, no lesions              Cervix: no lesions.  IUD strings not visible.              Pap taken: No. Bimanual Exam:  Uterus:  normal size, contour, position, consistency, mobility, non-tender              Adnexa: no mass, fullness, tenderness              Rectal exam: Yes.  .  Confirms.              Anus:  normal sphincter tone, no lesions  Chaperone was present for exam.  Assessment:   Well woman visit with normal exam. Expiring Mirena IUD.  IUD previously seen with pelvic US. Menopausal symptoms.  Urinary frequency.  Pelvic cramping.   Plan: Mammogram screening discussed. Self breast awareness reviewed. Pap and HR HPV as above. Guidelines for Calcium, Vitamin D, regular exercise program including cardiovascular and weight bearing exercise. We discussed menopause and reproductive, cardiovascular, and bone changes.  We discussed HRT, gabapentin (declined by patient due to sleepiness with this in past), and SSRIs/SNRIs. Discused WHI and use of HRT which can increase risk of PE, DVT, MI, stroke and breast cancer.  Will consider HRT and can continue with current Mirena IUD.  Check routine labs, [place], and E2.  If pelvic cramping continues, will do pelvic US. Follow up annually and prn.   After visit summary provided.

## 2019-05-01 ENCOUNTER — Ambulatory Visit (INDEPENDENT_AMBULATORY_CARE_PROVIDER_SITE_OTHER): Payer: 59 | Admitting: Obstetrics and Gynecology

## 2019-05-01 ENCOUNTER — Encounter: Payer: Self-pay | Admitting: Obstetrics and Gynecology

## 2019-05-01 ENCOUNTER — Other Ambulatory Visit: Payer: Self-pay

## 2019-05-01 VITALS — BP 138/84 | HR 66 | Temp 97.4°F | Resp 16 | Ht 66.0 in | Wt 169.4 lb

## 2019-05-01 DIAGNOSIS — R350 Frequency of micturition: Secondary | ICD-10-CM

## 2019-05-01 DIAGNOSIS — N951 Menopausal and female climacteric states: Secondary | ICD-10-CM

## 2019-05-01 DIAGNOSIS — Z01419 Encounter for gynecological examination (general) (routine) without abnormal findings: Secondary | ICD-10-CM

## 2019-05-01 LAB — POCT URINALYSIS DIPSTICK
Bilirubin, UA: NEGATIVE
Blood, UA: NEGATIVE
Glucose, UA: NEGATIVE
Ketones, UA: NEGATIVE
Leukocytes, UA: NEGATIVE
Nitrite, UA: NEGATIVE
Protein, UA: NEGATIVE
Urobilinogen, UA: 0.2 E.U./dL
pH, UA: 5 (ref 5.0–8.0)

## 2019-05-01 NOTE — Patient Instructions (Signed)

## 2019-05-02 LAB — COMPREHENSIVE METABOLIC PANEL
ALT: 15 IU/L (ref 0–32)
AST: 13 IU/L (ref 0–40)
Albumin/Globulin Ratio: 2.1 (ref 1.2–2.2)
Albumin: 4.6 g/dL (ref 3.8–4.9)
Alkaline Phosphatase: 55 IU/L (ref 39–117)
BUN/Creatinine Ratio: 16 (ref 9–23)
BUN: 12 mg/dL (ref 6–24)
Bilirubin Total: 0.6 mg/dL (ref 0.0–1.2)
CO2: 25 mmol/L (ref 20–29)
Calcium: 9.8 mg/dL (ref 8.7–10.2)
Chloride: 100 mmol/L (ref 96–106)
Creatinine, Ser: 0.73 mg/dL (ref 0.57–1.00)
GFR calc Af Amer: 110 mL/min/{1.73_m2} (ref >59)
GFR calc non Af Amer: 96 mL/min/{1.73_m2} (ref >59)
Globulin, Total: 2.2 g/dL (ref 1.5–4.5)
Glucose: 82 mg/dL (ref 65–99)
Potassium: 4.1 mmol/L (ref 3.5–5.2)
Sodium: 138 mmol/L (ref 134–144)
Total Protein: 6.8 g/dL (ref 6.0–8.5)

## 2019-05-02 LAB — CBC
Hematocrit: 43 % (ref 34.0–46.6)
Hemoglobin: 14.4 g/dL (ref 11.1–15.9)
MCH: 30.7 pg (ref 26.6–33.0)
MCHC: 33.5 g/dL (ref 31.5–35.7)
MCV: 92 fL (ref 79–97)
Platelets: 230 10*3/uL (ref 150–450)
RBC: 4.69 x10E6/uL (ref 3.77–5.28)
RDW: 12.4 % (ref 11.7–15.4)
WBC: 6 10*3/uL (ref 3.4–10.8)

## 2019-05-02 LAB — LIPID PANEL
Chol/HDL Ratio: 4.2 ratio (ref 0.0–4.4)
Cholesterol, Total: 236 mg/dL — ABNORMAL HIGH (ref 100–199)
HDL: 56 mg/dL (ref >39)
LDL Calculated: 156 mg/dL — ABNORMAL HIGH (ref 0–99)
Triglycerides: 122 mg/dL (ref 0–149)
VLDL Cholesterol Cal: 24 mg/dL (ref 5–40)

## 2019-05-02 LAB — FOLLICLE STIMULATING HORMONE: FSH: 5.4 m[IU]/mL

## 2019-05-02 LAB — ESTRADIOL: Estradiol: 149 pg/mL

## 2019-05-04 ENCOUNTER — Telehealth: Payer: Self-pay | Admitting: *Deleted

## 2019-05-04 NOTE — Telephone Encounter (Signed)
Notes recorded by Burnice Logan, RN on 05/04/2019 at 9:14 AM EDT  Left message to call Sharee Pimple, RN at [facility].

## 2019-05-04 NOTE — Telephone Encounter (Signed)
-----   Message from Nunzio Cobbs, MD sent at 05/03/2019  5:22 PM EDT ----- Please contact patient with results from her labs.  Her LDL cholesterol has gone back up, and I recommend she follow up with her PCP.  This has been climbing over the years.  Her metabolic profile and blood counts are normal.   Her hormonal testing indicates she is not menopausal.  I do recommend she proceed with a Mirena IUD exchange.  We can do a pelvic ultrasound prior to the IUD exchange due to her cramping for the last month.  All of these procedures will need to be precerted.

## 2019-05-09 ENCOUNTER — Encounter: Payer: Self-pay | Admitting: Obstetrics and Gynecology

## 2019-05-09 NOTE — Telephone Encounter (Signed)
Left message to call Thermon Zulauf, RN at [facility] [phone].   

## 2019-05-10 NOTE — Telephone Encounter (Signed)
Patient returned call. Results reviewed with patient as seen below from Dr. Quincy Simmonds and patient verbalized understanding. Patient states she was under the impression Dr. Quincy Simmonds wanted to leave the IUD in for an extra year? RN advised would review with Dr. Quincy Simmonds and return call. Patient agreeable.   Patient also asking if Dr. Quincy Simmonds can recommend anything for her for the hot flashes she is having?   Routing to provider for review.

## 2019-05-10 NOTE — Telephone Encounter (Signed)
MyChart message to patient.     From  Draughn, Tonye Becket To  Nunzio Cobbs, MD Sent  05/09/2019 10:45 PM  Hi Dr. Quincy Simmonds,      I saw my test results/labs have come back and I was wondering what the plan should be now based on these results.  Thank you, [employer]

## 2019-05-10 NOTE — Telephone Encounter (Signed)
See telephone encounter dated 05/04/19.   Encounter closed.

## 2019-05-10 NOTE — Telephone Encounter (Signed)
Her Mirena IUD can be used for a total of 6 years if she wishes.  Her [place] is not elevated, so I thought she may want to consider a new IUD.  I am ok with either plan.  If she is still having cramping in her lower abdomen, a pelvic ultrasound would be helpful.   For her hot flashes, I would recommend Vivelle Dot transdermal estrogen, 0.5 mg twice weekly.  Disp:  8 patches RF:  2  I would recommend a follow up visit in 3 months to check her response to the estrogen therapy.

## 2019-05-11 NOTE — Telephone Encounter (Signed)
Message left to return call to Emily at [phone].    

## 2019-05-12 ENCOUNTER — Encounter: Payer: Self-pay | Admitting: Obstetrics and Gynecology

## 2019-05-12 ENCOUNTER — Other Ambulatory Visit: Payer: Self-pay | Admitting: Obstetrics and Gynecology

## 2019-05-12 MED ORDER — ESTRADIOL 0.05 MG/24HR TD PTTW: 1.0000 | MEDICATED_PATCH | TRANSDERMAL | 2 refills | Status: DC

## 2019-05-12 NOTE — Telephone Encounter (Signed)
Left message to call Kimbely Whiteaker, RN at [facility] [phone].   

## 2019-05-12 NOTE — Telephone Encounter (Signed)
Patient returning call.

## 2019-05-14 ENCOUNTER — Other Ambulatory Visit: Payer: Self-pay | Admitting: Obstetrics and Gynecology

## 2019-05-16 NOTE — Telephone Encounter (Signed)
Patient has been notified of recommendations by Dr. Quincy Simmonds via [place] message, see encounter dated 05/12/19.   Routing to Dr. Quincy Simmonds  Encounter closed.

## 2019-06-09 ENCOUNTER — Other Ambulatory Visit: Payer: Self-pay | Admitting: Obstetrics and Gynecology

## 2019-06-09 NOTE — Telephone Encounter (Signed)
Medication refill request: Vivelle-Dot Last AEX:  05/01/2019 BS Next AEX: 05/22/2020 Last MMG (if hormonal medication request): 05/26/2018 BIRADS 1 Negative Density C Refill authorized: Pending #24 with 2 refills if appropriate. Please advise.

## 2019-06-27 NOTE — Patient Instructions (Addendum)
It was a pleasure to see you again today!  Please set up your mammogram at your convenience Your EKG today is normal If you would like, we can set you up for an ambulatory heart monitor in hopes of determining the cause of your palpitations.  However if not otherwise bothersome you most likely have benign PVCs  If you would like, we can start you on a statin to help reduce any future risk of CV disease.  However your 10 year risk profile looks good  The 10-year ASCVD risk score Mikey Bussing DC Brooke Bonito., et al., 2013) is: 1.7%   Values used to calculate the score:     Age: 53 years     Sex: Female     Is Non-Hispanic African American: No     Diabetic: No     Tobacco smoker: No     Systolic Blood Pressure: [id number] mmHg     Is BP treated: No     HDL Cholesterol: 56 mg/dL     Total Cholesterol: 236 mg/dL

## 2019-06-27 NOTE — Progress Notes (Signed)
[place] at [facility] [address] [place], [place] [address] [phone]  Date:  06/29/2019   Name:  [name]   DOB:  1966-03-23   MRN:  [id number]  PCP:  Darreld Mclean, MD    Chief Complaint: Dyslipidemia (follow up)   History of Present Illness:  [name] is a 53 y.o. 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  She has 3 teenage children- 23, 71 and 88 yo this year Her well woman care as per her OB/GYN, Dr. Frankey Shown in her office in August She has a Mirena IUD Her father did suffer an MI at age 40  Dr. Quincy Simmonds did labs for her in August-her total and LDL cholesterol numbers are high, but her 10-year cardiovascular risk score is still good  The 10-year ASCVD risk score Mikey Bussing DC Brooke Bonito., et al., 2013) is: 1.7%   Values used to calculate the score:     Age: 87 years     Sex: Female     Is Non-Hispanic African American: No     Diabetic: No     Tobacco smoker: No     Systolic Blood Pressure: [id number] mmHg     Is BP treated: No     HDL Cholesterol: 56 mg/dL     Total Cholesterol: 236 mg/dL  Blood counts were normal, metabolic profile normal  Flu shot- done  Mammogram- per the breast center, she will arrange this   She notes that she may get heart palp since her 53 yo was born- she had an EKG which was negative  She will feel a skipped beat now and then- lasts just a moment.  Coughing may get it to stop May occur off an on- might occur several times in a row, then nothing for several weeks For exercise she enjoys walking and barre exercises No CP or SOB with exercise or other times  Patient Active Problem List   Diagnosis Date Noted  . Hx of migraine headaches 03/03/2017  . Dyslipidemia  03/03/2017    Past Medical History:  Diagnosis Date  . Abnormal Pap smear of cervix 1996   Cryo  . Dysplasia of cervix 1996   cryo, no abn paps since  . Hypercholesteremia 2018   Patient denies  . Migraine    w/o aura  . Placenta accreta    Noted with last cesarean section 2007    Past Surgical History:  Procedure Laterality Date  . CESAREAN SECTION  03, 05, 07  . CRYOTHERAPY N/A 1996   cervix  . DILATION AND CURETTAGE OF UTERUS     times 2    Social History   Tobacco Use  . Smoking status: Never Smoker  . Smokeless tobacco: Never Used  Substance Use Topics  . Alcohol use: No    Alcohol/week: 0.0 standard drinks    Comment: rarely  . Drug use: No    Family History  Problem Relation Age of Onset  . Heart attack Father 46       while in [place], bypass  . Diabetes Father   . Dementia Father   . Hypertension Mother   . Osteoporosis Mother   . Diabetes Maternal Grandfather  questionable  . Hypertension Maternal Grandmother   . Heart disease Maternal Grandmother        bypass  . Heart disease Paternal Grandfather   . Colon cancer Neg Hx   . Esophageal cancer Neg Hx   . Rectal cancer Neg Hx   . Stomach cancer Neg Hx     No Known Allergies  Medication list has been reviewed and updated.  Current Outpatient Medications on File Prior to Visit  Medication Sig Dispense Refill  . AIMOVIG 70 MG/ML SOAJ 70 mg every 30 (thirty) days.  2  . estradiol (VIVELLE-DOT) 0.05 MG/24HR patch PLACE 1 PATCH (0.05 MG TOTAL) ONTO THE SKIN 2 (TWO) TIMES A WEEK. 24 patch 0  . levonorgestrel (MIRENA) 20 MCG/24HR IUD 1 each by Intrauterine route once. Inserted 05/14/14    . naproxen (NAPROSYN) 500 MG tablet TAKE 1 TABLET AS NEEDED FOR HEADACHE UP TO TWICE A DAY . LIMIT USE TO 1 TO 2 DAYS/WK.  0  . SUMAtriptan (IMITREX) 100 MG tablet      No current facility-administered medications on file prior to visit.     Review of Systems:  As per HPI- otherwise  negative.   Physical Examination: Vitals:   06/29/19 0854  BP: 130/70  Pulse: 70  Resp: 16  Temp: (!) 96.8 F (36 C)  SpO2: 98%   Vitals:   06/29/19 0854  Weight: 166 lb (75.3 kg)  Height: 5\' 6"  (1.676 m)   Body mass index is 26.79 kg/m. Ideal Body Weight: Weight in (lb) to have BMI = 25: 154.6  GEN: WDWN, NAD, Non-toxic, A & O x 3, normal weight, looks well  HEENT: Atraumatic, Normocephalic. Neck supple. No masses, No LAD. Ears and Nose: No external deformity. CV: RRR, No M/G/R. No JVD. No thrill. No extra heart sounds. PULM: CTA B, no wheezes, crackles, rhonchi. No retractions. No resp. distress. No accessory muscle use. ABD: S, NT, ND,. No rebound. No HSM. EXTR: No c/c/e NEURO Normal gait.  PSYCH: Normally interactive. Conversant. Not depressed or anxious appearing.  Calm demeanor.   EKG: NSR, looks fine- no irregularity  No old tracing for comparison on chart  Assessment and Plan: Hx of migraine headaches  Dyslipidemia  Palpitations - Plan: EKG 12-Lead  Discussed shingrix- she will delay for now due to pandemic Headaches are under okay control Discussed her lipids.  Her total cholesterol and LDL are somewhat high, however her 10-year cardiovascular disease risk profile is still very favorable.  I offered to start her on a statin and she would like, for now she wishes to think about it She has noted palpitations for over a decade, no adverse consequences.  Likely she is having periodic PVCs.  We discussed doing a Holter monitor or Zio patch, for now she will continue to observe She will let me know if any changes or concerns   Signed Lamar Blinks, MD

## 2019-06-28 ENCOUNTER — Other Ambulatory Visit: Payer: Self-pay

## 2019-06-29 ENCOUNTER — Other Ambulatory Visit: Payer: Self-pay

## 2019-06-29 ENCOUNTER — Encounter: Payer: Self-pay | Admitting: Family Medicine

## 2019-06-29 ENCOUNTER — Ambulatory Visit (INDEPENDENT_AMBULATORY_CARE_PROVIDER_SITE_OTHER): Payer: 59 | Admitting: Family Medicine

## 2019-06-29 VITALS — BP 130/70 | HR 70 | Temp 96.8°F | Resp 16 | Ht 66.0 in | Wt 166.0 lb

## 2019-06-29 DIAGNOSIS — R002 Palpitations: Secondary | ICD-10-CM

## 2019-06-29 DIAGNOSIS — E785 Hyperlipidemia, unspecified: Secondary | ICD-10-CM | POA: Diagnosis not present

## 2019-06-29 DIAGNOSIS — Z8669 Personal history of other diseases of the nervous system and sense organs: Secondary | ICD-10-CM

## 2019-09-19 ENCOUNTER — Other Ambulatory Visit: Payer: Self-pay

## 2019-09-19 MED ORDER — ESTRADIOL 0.05 MG/24HR TD PTTW: 1.0000 | MEDICATED_PATCH | TRANSDERMAL | 0 refills | Status: DC

## 2019-09-19 NOTE — Telephone Encounter (Signed)
Please let patient know that she is overdue for her mammogram from what I can see.   I can refill her transdermal estrogen for one month to give her an opportunity to complete this.

## 2019-09-19 NOTE — Telephone Encounter (Signed)
Medication refill request: Estradiol .05 patch  Last AEX:  05/01/19  Next AEX: 05/22/20 Last MMG (if hormonal medication request): 05/26/18 Bi-rads 1 neg  Refill authorized: #24 with 0 RF please advise

## 2019-09-20 NOTE — Telephone Encounter (Signed)
Left message for pt to call to triage Colletta Maryland, RN.

## 2019-09-21 NOTE — Telephone Encounter (Signed)
Left messages on both mobile and home numbers for pt to call back for MMG update or orders to schedule.

## 2019-10-19 ENCOUNTER — Other Ambulatory Visit: Payer: Self-pay | Admitting: Obstetrics and Gynecology

## 2019-10-25 ENCOUNTER — Telehealth: Payer: Self-pay

## 2019-10-25 NOTE — Telephone Encounter (Signed)
SWP and explained to pt, per the account note in pt's snapshot under transaction inquiry, her [place] policy became effective 02/13/2020 and she has a deductible of $6,750 and as of 10/13/19 only $2,844.92 has been met.  Once pt has met that deductible, the pt's plan will cover at 100%.  Pt stated she thought this visit was for a wellness.  This visit was for a follow up for headaches, dyslipidemia & palpitations, thus resulting in the bill.

## 2019-10-25 NOTE — Telephone Encounter (Signed)
Patient called in to speak with Anderson Malta on yesterday 10/24/2019 patient will need a follow up call back as soon as possible at [phone]  Thanks,

## 2019-10-26 ENCOUNTER — Telehealth: Payer: Self-pay | Admitting: Obstetrics and Gynecology

## 2019-10-26 NOTE — Telephone Encounter (Signed)
Left message to call Sharee Pimple, RN at [facility].   Please schedule 3 mo f/u MyChart visit with Dr. Quincy Simmonds

## 2019-10-26 NOTE — Telephone Encounter (Signed)
Patient is ready to schedule her 3 month estrogen patch recheck. She thought Dr.Silva said this could be done virtually To to triage to assist with Dr.Silva's schedule.

## 2019-10-31 NOTE — Telephone Encounter (Signed)
Left message to call Satine Hausner, RN at [facility] [phone].   

## 2019-11-01 NOTE — Telephone Encounter (Signed)
Spoke with patient. Patient request to schedule MyChart visit for HRT f/u. MyChart visit scheduled for 2/18 at 1pm. Patient is concerned about inclement weather and has a limited schedule. Advised patient our office will contact her prior to her visit if MyChart visit needs to be r/s due to weather. Patient also scheduled for MyChart visit on 2/23 at 4pm. Patient is aware this will need to be cancelled if she has her visit on 2/18.   Routing to provider for final review. Patient is agreeable to disposition. Will close encounter.

## 2019-11-02 ENCOUNTER — Telehealth: Payer: Self-pay | Admitting: Obstetrics and Gynecology

## 2019-11-07 ENCOUNTER — Other Ambulatory Visit: Payer: Self-pay

## 2019-11-07 ENCOUNTER — Telehealth (INDEPENDENT_AMBULATORY_CARE_PROVIDER_SITE_OTHER): Payer: 59 | Admitting: Obstetrics and Gynecology

## 2019-11-07 DIAGNOSIS — Z79890 Hormone replacement therapy (postmenopausal): Secondary | ICD-10-CM

## 2019-11-07 DIAGNOSIS — N951 Menopausal and female climacteric states: Secondary | ICD-10-CM

## 2019-11-07 MED ORDER — ESTRADIOL 0.075 MG/24HR TD PTTW: 1.0000 | MEDICATED_PATCH | TRANSDERMAL | 0 refills | Status: DC

## 2019-11-07 NOTE — Progress Notes (Signed)
GYNECOLOGY  VISIT   HPI: 54 y.o.   Married  Caucasian  female   [phone] with No LMP recorded. (Menstrual status: IUD).  here for  HRT follow up  She gives permission for the visit.  Arranged by Glorianne Manchester, RN. I am in my office.  She is in her kitchen.  Started at 4:18.  Ended at 4:32.  She is on her estradiol patches for 4 months.  She states she is very hot natured.  The estrogen ran out one week ago, and she had a really hot day.  No increased heat at night.  Not sleeping well and is waking up earlier in the last 1.5 weeks.  No change in her headaches.   Occasional spotting, last time of January, 2021.  She has Mirena IUD, placed 05/14/14.   Her [place] was 5.4 and estradiol 149.0 on 05/01/19, which was before she started her treatment for menopausal symptoms.   GYNECOLOGIC HISTORY: No LMP recorded. (Menstrual status: IUD). Contraception:  mirena IUD/ vasectomy.  Mirena placed 05/14/14. Menopausal hormone therapy:  vivelle- dot  Last mammogram:  05-26-18 density C/BIRADS 1 negative  Last pap smear:   01-31-18 negative, HR HPV negative         OB History    Gravida  5   Para  3   Term  2   Preterm  1   AB  2   Living  3     SAB  2   TAB      Ectopic      Multiple      Live Births  3              Patient Active Problem List   Diagnosis Date Noted  . Hx of migraine headaches 03/03/2017  . Dyslipidemia 03/03/2017    Past Medical History:  Diagnosis Date  . Abnormal Pap smear of cervix 1996   Cryo  . Dysplasia of cervix 1996   cryo, no abn paps since  . Hypercholesteremia 2018   Patient denies  . Migraine    w/o aura  . Placenta accreta    Noted with last cesarean section 2007    Past Surgical History:  Procedure Laterality Date  . CESAREAN SECTION  03, 05, 07  . CRYOTHERAPY N/A 1996   cervix  . DILATION AND CURETTAGE OF UTERUS     times 2    Current Outpatient Medications  Medication Sig Dispense Refill  . AIMOVIG 70 MG/ML SOAJ 70 mg  every 30 (thirty) days.  2  . estradiol (VIVELLE-DOT) 0.075 MG/24HR Place 1 patch onto the skin 2 (two) times a week. 8 patch 0  . levonorgestrel (MIRENA) 20 MCG/24HR IUD 1 each by Intrauterine route once. Inserted 05/14/14    . naproxen (NAPROSYN) 500 MG tablet TAKE 1 TABLET AS NEEDED FOR HEADACHE UP TO TWICE A DAY . LIMIT USE TO 1 TO 2 DAYS/WK.  0  . SUMAtriptan (IMITREX) 100 MG tablet      No current facility-administered medications for this visit.     ALLERGIES: Patient has no known allergies.  Family History  Problem Relation Age of Onset  . Heart attack Father 49       while in [place], bypass  . Diabetes Father   . Dementia Father   . Hypertension Mother   . Osteoporosis Mother   . Diabetes Maternal Grandfather        questionable  . Hypertension Maternal Grandmother   . Heart  disease Maternal Grandmother        bypass  . Heart disease Paternal Grandfather   . Colon cancer Neg Hx   . Esophageal cancer Neg Hx   . Rectal cancer Neg Hx   . Stomach cancer Neg Hx     Social History   Socioeconomic History  . Marital status: Married    Spouse name: Not on file  . Number of children: Not on file  . Years of education: Not on file  . Highest education level: Not on file  Occupational History  . Not on file  Tobacco Use  . Smoking status: Never Smoker  . Smokeless tobacco: Never Used  Substance and Sexual Activity  . Alcohol use: No    Alcohol/week: 0.0 standard drinks    Comment: rarely  . Drug use: No  . Sexual activity: Yes    Partners: Male    Birth control/protection: Surgical, I.U.D.    Comment: vasectomy/ Mirena Inserted 05/14/14  Other Topics Concern  . Not on file  Social History Narrative  . Not on file   Social Determinants of Health   Financial Resource Strain:   . Difficulty of Paying Living Expenses: Not on file  Food Insecurity:   . Worried About Charity fundraiser in the Last Year: Not on file  . Ran Out of Food in the Last Year: Not  on file  Transportation Needs:   . Lack of Transportation (Medical): Not on file  . Lack of Transportation (Non-Medical): Not on file  Physical Activity:   . Days of Exercise per Week: Not on file  . Minutes of Exercise per Session: Not on file  Stress:   . Feeling of Stress : Not on file  Social Connections:   . Frequency of Communication with Friends and Family: Not on file  . Frequency of Social Gatherings with Friends and Family: Not on file  . Attends Religious Services: Not on file  . Active Member of Clubs or Organizations: Not on file  . Attends Archivist Meetings: Not on file  . Marital Status: Not on file  Intimate Partner Violence:   . Fear of Current or Ex-Partner: Not on file  . Emotionally Abused: Not on file  . Physically Abused: Not on file  . Sexually Abused: Not on file    Review of Systems  See HPI.  PHYSICAL EXAMINATION:    There were no vitals taken for this visit.    General appearance: alert, cooperative and appears stated age  ASSESSMENT  Perimenopausal female.  Mirena IUD.  Doing extended use.   HRT with transdermal estrogen and Mirena.   PLAN  We discussed her hormonal status being perimenopausal and not menopausal. Will increase her transdermal estrogen to Vivelle dot 0.075 mg twice weekly.  If no improvement, will re-evaluate and check TFTs.  Mirena IUD due for removal 05/14/20. She will update her mammogram.   An After Visit Summary was printed and given to the patient.  ___14___ minutes face to face time of which over 50% was spent in counseling.

## 2019-11-09 ENCOUNTER — Encounter: Payer: Self-pay | Admitting: Obstetrics and Gynecology

## 2019-11-17 ENCOUNTER — Other Ambulatory Visit: Payer: Self-pay | Admitting: Obstetrics and Gynecology

## 2019-11-17 DIAGNOSIS — Z1231 Encounter for screening mammogram for malignant neoplasm of breast: Secondary | ICD-10-CM

## 2019-11-27 ENCOUNTER — Other Ambulatory Visit: Payer: Self-pay | Admitting: Obstetrics and Gynecology

## 2019-11-28 NOTE — Telephone Encounter (Signed)
Medication refill request: vivelle dot patch 0.075mg  Last OV: video visit 11-07-2019 Last AEX:  05-01-2019 Next AEX: 05-22-2020 Last MMG (if hormonal medication request): 05-26-18 birads 1:neg, scheduled for 12-01-2019 Refill authorized: left message for patient to callback to get update on how she is doing.

## 2019-12-01 ENCOUNTER — Ambulatory Visit
Admission: RE | Admit: 2019-12-01 | Discharge: 2019-12-01 | Disposition: A | Discharge status: Home / Self Care | Payer: 59 | Source: Ambulatory Visit | Attending: Obstetrics and Gynecology | Admitting: Obstetrics and Gynecology

## 2019-12-01 ENCOUNTER — Other Ambulatory Visit: Payer: Self-pay

## 2019-12-01 DIAGNOSIS — Z1231 Encounter for screening mammogram for malignant neoplasm of breast: Secondary | ICD-10-CM

## 2019-12-01 NOTE — Telephone Encounter (Signed)
Patient returned call. Medication is not at pharmacy and she want to make sure of cell number because she did not get messages. Cell number was verified with patient.

## 2019-12-01 NOTE — Telephone Encounter (Signed)
Patient is returning a call to [place]. She states she is doing well. The medication has not been sent to the pharmacy.

## 2019-12-01 NOTE — Telephone Encounter (Signed)
Routing to Dr Quincy Simmonds. Please approve rx if appropriate

## 2019-12-01 NOTE — Telephone Encounter (Signed)
Left message for patient to let her know that rx was sent to provider for review.

## 2019-12-04 NOTE — Telephone Encounter (Signed)
Message left letting patient know that rx was sent to the pharmacy.

## 2019-12-13 ENCOUNTER — Telehealth: Payer: Self-pay | Admitting: Obstetrics and Gynecology

## 2019-12-13 ENCOUNTER — Telehealth: Payer: Self-pay

## 2019-12-13 NOTE — Telephone Encounter (Signed)
Patient called regarding a recurring UTI. Patient is out of town and stated she would like to speak to a nurse for a possible medication call in.

## 2019-12-13 NOTE — Telephone Encounter (Signed)
Her final UC was negative for infection.  I recommend she be seen for a visit at an urgent care where she is traveling.

## 2019-12-13 NOTE — Telephone Encounter (Signed)
Patient is returning call to Kaitlyn.  

## 2019-12-13 NOTE — Telephone Encounter (Signed)
Spoke with pt. Pt given recommendations from Dr Quincy Simmonds. Pt will either be seen by urgent care in [facility] and give another urine sample  or start Macrobid Rx as prescribed by Dr Quincy Simmonds in past and use AZO for discomfort.. Pt states if does not go to urgent care while on vacation, then will call the office on Monday with update and will possibly be seen. Pt verbalized understanding.   Routing to Dr Quincy Simmonds for review and will close encounter.

## 2019-12-13 NOTE — Telephone Encounter (Signed)
Spoke to [name]. [name] states having UTI sx of urgency that she is still having after finishing augmentin abx last Friday that was [facility]. [name] states not completley not gone away and is out of town in [place]. [name] wanting to know if she can get another abx? Will discuss with Dr Quincy Simmonds and return call to [name]. [name] agreeable.   Routing to Dr Quincy Simmonds for recommendations.

## 2019-12-13 NOTE — Telephone Encounter (Signed)
Left message to call Kaitlyn at [phone]. 

## 2019-12-13 NOTE — Telephone Encounter (Signed)
Left message to call Jonnathan Birman at [phone]. 

## 2019-12-13 NOTE — Telephone Encounter (Signed)
Please contact patient back regarding her call about UTI like symptoms.   I do not recommend she take Macrobid.   I recommend she go to urgent care for evaluation and treatment.   She tested negative for a UTI at the [facility].

## 2019-12-13 NOTE — Telephone Encounter (Signed)
Left message for pt to speak to triage RN.

## 2019-12-18 ENCOUNTER — Ambulatory Visit: Payer: 59 | Admitting: Obstetrics and Gynecology

## 2019-12-18 NOTE — Telephone Encounter (Signed)
Encounter reviewed and closed.  

## 2019-12-18 NOTE — Telephone Encounter (Signed)
Patient canceled her UTI appointment today via the answering machine. She is still waiting for her urine culture results from the [facility].

## 2019-12-19 ENCOUNTER — Telehealth: Payer: Self-pay | Admitting: Obstetrics and Gynecology

## 2019-12-19 NOTE — Telephone Encounter (Signed)
Pt returned call. Pt states being seen at [facility] on 12/13/2019 in [facility] while on vacation. Pt reports gave urine sample. Results given today of urine culture from NP at [facility], results were showing E-coli and Klebsiella. Pt given Cipro Rx 250mg  BID x 5 days. Pt states still having discomfort and urgency today before getting results from UC back. Pt also reports taking old Macrobid Rx that she started on 12/13/2019 for UTI sx. Pt reports was advised to start taking Cipro for UC results.  Pt has hx of recurrent UTIs and had cancelled OV with Dr Quincy Simmonds on 12/18/2019 due to states "feeling better" and not having UC results.   Pt wanting to know what to do with Rx. Should she take last day(today) of Macrobid and start Cipro tomorrow or not finish last day of Macrobid Rx today and start Cipro today? Will discuss with Dr Quincy Simmonds and return call to pt. Pt agreeable.   Routing to Dr Quincy Simmonds for recommendations.

## 2019-12-19 NOTE — Telephone Encounter (Signed)
I was able to see her UC results in Epic.  She has E coli and Klebsiella sensitive to Cipro and Macrobid.  If she is still symptomatic after taking 5 days of Macrobid, I recommend she complete the course of Ciprofloxacin 250 po bid x 5 days.  If her symptoms persist, I recommend an office visit.

## 2019-12-19 NOTE — Telephone Encounter (Signed)
Left message for pt to return call to triage RN. 

## 2019-12-19 NOTE — Telephone Encounter (Signed)
Pt returned call. Spoke with pt. Pt given recommendations per Dr Quincy Simmonds as seen below. Pt agreeable and verbalized understanding to start Cipro Rx today. Pt aware to call after course of treatment if sx persist, then to have OV. Pt agreeable.   Routing to Dr Quincy Simmonds for review.  Encounter closed.

## 2019-12-19 NOTE — Telephone Encounter (Signed)
Patient is asking to talk with Dr.Silva's nurse regarding her reoccurring UTI.

## 2020-01-29 ENCOUNTER — Other Ambulatory Visit: Payer: Self-pay | Admitting: *Deleted

## 2020-01-29 NOTE — Telephone Encounter (Signed)
Medication refill request: Vivelle- Dot  Last AEX:  05-01-2019 BS Next AEX: 05-22-20 Last MMG (if hormonal medication request): 12-01-19 density C/BIRADS 1 negative  Refill authorized: Today, please advise.   Pharmacy requesting 90 day supply.   Medication pended for #24, 1RF. Please refill if appropriate.

## 2020-01-30 MED ORDER — ESTRADIOL 0.075 MG/24HR TD PTTW: 1.0000 | MEDICATED_PATCH | TRANSDERMAL | 0 refills | Status: DC

## 2020-04-24 ENCOUNTER — Other Ambulatory Visit: Payer: Self-pay

## 2020-04-24 MED ORDER — ESTRADIOL 0.075 MG/24HR TD PTTW: 1.0000 | MEDICATED_PATCH | TRANSDERMAL | 0 refills | Status: DC

## 2020-04-24 NOTE — Telephone Encounter (Signed)
Medication refill request: estradiol 0.075mg  Last AEX:  05-01-2019 Next AEX: 05-22-2020 Last MMG (if hormonal medication request): 12-04-2019 category c density birads 1:neg Refill authorized: please approve until aex if appropriate

## 2020-05-13 ENCOUNTER — Encounter: Payer: Self-pay | Admitting: Obstetrics and Gynecology

## 2020-05-13 ENCOUNTER — Telehealth: Payer: Self-pay | Admitting: Obstetrics and Gynecology

## 2020-05-13 ENCOUNTER — Other Ambulatory Visit: Payer: Self-pay

## 2020-05-13 ENCOUNTER — Ambulatory Visit (INDEPENDENT_AMBULATORY_CARE_PROVIDER_SITE_OTHER): Payer: No Typology Code available for payment source | Admitting: Obstetrics and Gynecology

## 2020-05-13 VITALS — BP 138/80 | HR 72 | Resp 14 | Ht 67.0 in | Wt 177.4 lb

## 2020-05-13 DIAGNOSIS — R1030 Lower abdominal pain, unspecified: Secondary | ICD-10-CM | POA: Diagnosis not present

## 2020-05-13 DIAGNOSIS — R102 Pelvic and perineal pain: Secondary | ICD-10-CM | POA: Diagnosis not present

## 2020-05-13 DIAGNOSIS — Z975 Presence of (intrauterine) contraceptive device: Secondary | ICD-10-CM

## 2020-05-13 LAB — POCT URINALYSIS DIPSTICK
Bilirubin, UA: NEGATIVE
Blood, UA: NEGATIVE
Glucose, UA: NEGATIVE
Ketones, UA: NEGATIVE
Leukocytes, UA: NEGATIVE
Nitrite, UA: NEGATIVE
Protein, UA: NEGATIVE
Urobilinogen, UA: 0.2 E.U./dL
pH, UA: 5 (ref 5.0–8.0)

## 2020-05-13 NOTE — Telephone Encounter (Signed)
[facility] 05/22/20 with Dr Quincy Simmonds  H/o perimenopausal Mirena IUD, placed 04/2014, scheduled for removal with [facility]  Using HRT patches   Spoke with pt. Pt states having lower abd pain with walking x 1 month ago. Pt states was hard to walk while on vacation x 1 month ago, had to sit frequently. Pt knows that IUD due to be removed. States Dr Quincy Simmonds unable to find strings last year at [facility].  States pain got better, but had another "flare" yesterday. Pt rates pain as 4 on pain scale. Denies any vaginal bleeding or cramps.  Pt states thought had UTI and took Macrobid Rx and was better. Pt states drinks a lot of water, so unsure if frequency due to sx or just increased intake of water.   Pt advised to have OV for further evaluation. Pt agreeable. Pt scheduled as work-in today with Dr Quincy Simmonds 8/30 at 315 pm. Pt agreeable and verbalized understanding.   Encounter closed.

## 2020-05-13 NOTE — Progress Notes (Signed)
GYNECOLOGY  VISIT   HPI: 54 y.o.   Married  Caucasian  female   [phone] with No LMP recorded. (Menstrual status: IUD).   here for   Lower abdominal pain off and on for one month.  Lower abdominal pain, cramping, constant all day.  Voiding more often also.  No dysuria.   Pain is better today.   Tried Naprosyn, which did not help with pain.   Had similar pain when she went to [place] 4 weeks ago.   No vaginal bleeding.  Has Mirena IUD and on Vivelle Dot for menopausal vasomotor symptoms.   Some constipation, but not usually.  No diarrhea.  No nausea or vomiting.   Urine dip - negative.   GYNECOLOGIC HISTORY: No LMP recorded. (Menstrual status: IUD). Contraception: IUD - Mirena placed 05/14/14. Menopausal hormone therapy:  vivelle-dot patch, Mirena. Last mammogram:  12-01-19 density C/BIRADS 1 negative  Last pap smear:   01-31-18 negative, HR HPV negative         OB History    Gravida  5   Para  3   Term  2   Preterm  1   AB  2   Living  3     SAB  2   TAB      Ectopic      Multiple      Live Births  3              Patient Active Problem List   Diagnosis Date Noted  . Hx of migraine headaches 03/03/2017  . Dyslipidemia 03/03/2017    Past Medical History:  Diagnosis Date  . Abnormal Pap smear of cervix 1996   Cryo  . Dysplasia of cervix 1996   cryo, no abn paps since  . Hypercholesteremia 2018   Patient denies  . Migraine    w/o aura  . Placenta accreta    Noted with last cesarean section 2007    Past Surgical History:  Procedure Laterality Date  . CESAREAN SECTION  03, 05, 07  . CRYOTHERAPY N/A 1996   cervix  . DILATION AND CURETTAGE OF UTERUS     times 2    Current Outpatient Medications  Medication Sig Dispense Refill  . AIMOVIG 70 MG/ML SOAJ 70 mg every 30 (thirty) days.  2  . estradiol (VIVELLE-DOT) 0.075 MG/24HR Place 1 patch onto the skin 2 (two) times a week. 8 patch 0  . levonorgestrel (MIRENA) 20 MCG/24HR IUD 1 each by  Intrauterine route once. Inserted 05/14/14    . naproxen (NAPROSYN) 500 MG tablet TAKE 1 TABLET AS NEEDED FOR HEADACHE UP TO TWICE A DAY . LIMIT USE TO 1 TO 2 DAYS/WK.  0  . SUMAtriptan (IMITREX) 100 MG tablet      No current facility-administered medications for this visit.     ALLERGIES: Patient has no known allergies.  Family History  Problem Relation Age of Onset  . Heart attack Father 80       while in [place], bypass  . Diabetes Father   . Dementia Father   . Hypertension Mother   . Osteoporosis Mother   . Diabetes Maternal Grandfather        questionable  . Hypertension Maternal Grandmother   . Heart disease Maternal Grandmother        bypass  . Heart disease Paternal Grandfather   . Colon cancer Neg Hx   . Esophageal cancer Neg Hx   . Rectal cancer Neg Hx   .  Stomach cancer Neg Hx     Social History   Socioeconomic History  . Marital status: Married    Spouse name: Not on file  . Number of children: Not on file  . Years of education: Not on file  . Highest education level: Not on file  Occupational History  . Not on file  Tobacco Use  . Smoking status: Never Smoker  . Smokeless tobacco: Never Used  Vaping Use  . Vaping Use: Never used  Substance and Sexual Activity  . Alcohol use: No    Alcohol/week: 0.0 standard drinks    Comment: rarely  . Drug use: No  . Sexual activity: Yes    Partners: Male    Birth control/protection: Surgical, I.U.D.    Comment: vasectomy/ Mirena Inserted 05/14/14  Other Topics Concern  . Not on file  Social History Narrative  . Not on file   Social Determinants of Health   Financial Resource Strain:   . Difficulty of Paying Living Expenses: Not on file  Food Insecurity:   . Worried About Charity fundraiser in the Last Year: Not on file  . Ran Out of Food in the Last Year: Not on file  Transportation Needs:   . Lack of Transportation (Medical): Not on file  . Lack of Transportation (Non-Medical): Not on file   Physical Activity:   . Days of Exercise per Week: Not on file  . Minutes of Exercise per Session: Not on file  Stress:   . Feeling of Stress : Not on file  Social Connections:   . Frequency of Communication with Friends and Family: Not on file  . Frequency of Social Gatherings with Friends and Family: Not on file  . Attends Religious Services: Not on file  . Active Member of Clubs or Organizations: Not on file  . Attends Archivist Meetings: Not on file  . Marital Status: Not on file  Intimate Partner Violence:   . Fear of Current or Ex-Partner: Not on file  . Emotionally Abused: Not on file  . Physically Abused: Not on file  . Sexually Abused: Not on file    Review of Systems  Gastrointestinal: Positive for abdominal pain.  Genitourinary: Positive for frequency.  All other systems reviewed and are negative.   PHYSICAL EXAMINATION:    BP 138/80 (BP Location: Right Arm, Patient Position: Sitting, Cuff Size: Normal)   Pulse 72   Resp 14   Ht 5\' 7"  (1.702 m)   Wt 177 lb 6.4 oz (80.5 kg)   BMI 27.78 kg/m     General appearance: alert, cooperative and appears stated age  Pelvic: External genitalia:  no lesions              Urethra:  normal appearing urethra with no masses, tenderness or lesions              Bartholins and Skenes: normal                 Vagina: normal appearing vagina with normal color and discharge, no lesions              Cervix: no lesions.  IUD strings not seen.                Bimanual Exam:  Uterus:  normal size, contour, position, consistency, mobility, non-tender              Adnexa: no mass, fullness, tenderness  Rectal exam: Yes.  .  Confirms.              Anus:  normal sphincter tone, no lesions  Chaperone was present for exam.  ASSESSMENT  Pelvic pain.  Uncertain etiology.  Mirena IUD, previously confirmed with pelvic US.  On HRT - Mirena and transdermal estrogen. Negative urine dip.  PLAN  She will try ibuprofen  for pain.  Return for pelvic US tomorrow.  We talked about potential use of Mirena for 7 years.  We also discussed potential removal if desired.

## 2020-05-13 NOTE — Progress Notes (Signed)
Patient scheduled while on office for PUS on 05/14/20 at 1:30pm with consult to follow with Dr. Quincy Simmonds. Patient is agreeable to date and time.  Order placed for precert, business office notified.

## 2020-05-13 NOTE — Telephone Encounter (Signed)
Patient is having "some pain" and would like an appointment with Dr.Silva. No further details givem.

## 2020-05-14 ENCOUNTER — Telehealth: Payer: Self-pay | Admitting: Obstetrics and Gynecology

## 2020-05-14 ENCOUNTER — Ambulatory Visit (INDEPENDENT_AMBULATORY_CARE_PROVIDER_SITE_OTHER): Payer: No Typology Code available for payment source | Admitting: Obstetrics and Gynecology

## 2020-05-14 ENCOUNTER — Encounter: Payer: Self-pay | Admitting: Obstetrics and Gynecology

## 2020-05-14 ENCOUNTER — Ambulatory Visit (INDEPENDENT_AMBULATORY_CARE_PROVIDER_SITE_OTHER): Payer: No Typology Code available for payment source

## 2020-05-14 VITALS — BP 118/76 | HR 76 | Ht 65.75 in | Wt 177.0 lb

## 2020-05-14 DIAGNOSIS — N951 Menopausal and female climacteric states: Secondary | ICD-10-CM | POA: Diagnosis not present

## 2020-05-14 DIAGNOSIS — Z30431 Encounter for routine checking of intrauterine contraceptive device: Secondary | ICD-10-CM | POA: Diagnosis not present

## 2020-05-14 DIAGNOSIS — R102 Pelvic and perineal pain: Secondary | ICD-10-CM

## 2020-05-14 NOTE — Telephone Encounter (Signed)
Spoke with patient regarding benefits for scheduled Pelvic ultrasound. Patient acknowledges understanding of information presented. Encounter closed. 

## 2020-05-14 NOTE — Telephone Encounter (Signed)
Call to patient. Per DPR, OK to leave message on voicemail.   Left voicemail requesting a return call to [name] to review benefits for scheduled Pelvic ultrasound with Brook A. Silva, MD, FACOG. 

## 2020-05-14 NOTE — Progress Notes (Signed)
GYNECOLOGY  VISIT   HPI: 54 y.o.   Married  Caucasian  female   [phone] with No LMP recorded. (Menstrual status: IUD).   here for pelvic ultrasound.   Patient has pelvic pain and has a Mirena IUD.  Pain is much better today than the last 2 days.  Some pain this am.  Did not take Ibuprofen.   She had a similar pain recently, and it lasted a couple of days.  She differentiates this from pain with a UTI.  Her urine dip was negative yesterday.  Has hot flashes even with the Vivelle Dot patch on.   GYNECOLOGIC HISTORY: No LMP recorded. (Menstrual status: IUD). Contraception: Mirena IUD 05-14-14 Menopausal hormone therapy: vivelle-dot patch, Mirena. Last mammogram:  12-01-19 density C/BIRADS 1 negative  Last pap smear: 01-31-18 Neg:Neg HR HPV, 11-14-14 Neg:Neg HR HPV, 10-18-13 Neg        OB History    Gravida  5   Para  3   Term  2   Preterm  1   AB  2   Living  3     SAB  2   TAB      Ectopic      Multiple      Live Births  3              Patient Active Problem List   Diagnosis Date Noted  . Hx of migraine headaches 03/03/2017  . Dyslipidemia 03/03/2017    Past Medical History:  Diagnosis Date  . Abnormal Pap smear of cervix 1996   Cryo  . Dysplasia of cervix 1996   cryo, no abn paps since  . Hypercholesteremia 2018   Patient denies  . Migraine    w/o aura  . Placenta accreta    Noted with last cesarean section 2007    Past Surgical History:  Procedure Laterality Date  . CESAREAN SECTION  03, 05, 07  . CRYOTHERAPY N/A 1996   cervix  . DILATION AND CURETTAGE OF UTERUS     times 2    Current Outpatient Medications  Medication Sig Dispense Refill  . AIMOVIG 70 MG/ML SOAJ 70 mg every 30 (thirty) days.  2  . estradiol (VIVELLE-DOT) 0.075 MG/24HR Place 1 patch onto the skin 2 (two) times a week. 8 patch 0  . levonorgestrel (MIRENA) 20 MCG/24HR IUD 1 each by Intrauterine route once. Inserted 05/14/14    . naproxen (NAPROSYN) 500 MG tablet TAKE 1  TABLET AS NEEDED FOR HEADACHE UP TO TWICE A DAY . LIMIT USE TO 1 TO 2 DAYS/WK.  0  . SUMAtriptan (IMITREX) 100 MG tablet      No current facility-administered medications for this visit.     ALLERGIES: Patient has no known allergies.  Family History  Problem Relation Age of Onset  . Heart attack Father 5       while in [place], bypass  . Diabetes Father   . Dementia Father   . Hypertension Mother   . Osteoporosis Mother   . Diabetes Maternal Grandfather        questionable  . Hypertension Maternal Grandmother   . Heart disease Maternal Grandmother        bypass  . Heart disease Paternal Grandfather   . Colon cancer Neg Hx   . Esophageal cancer Neg Hx   . Rectal cancer Neg Hx   . Stomach cancer Neg Hx     Social History   Socioeconomic History  . Marital status:  Married    Spouse name: Not on file  . Number of children: Not on file  . Years of education: Not on file  . Highest education level: Not on file  Occupational History  . Not on file  Tobacco Use  . Smoking status: Never Smoker  . Smokeless tobacco: Never Used  Vaping Use  . Vaping Use: Never used  Substance and Sexual Activity  . Alcohol use: No    Alcohol/week: 0.0 standard drinks    Comment: rarely  . Drug use: No  . Sexual activity: Yes    Partners: Male    Birth control/protection: Surgical, I.U.D.    Comment: vasectomy/ Mirena Inserted 05/14/14  Other Topics Concern  . Not on file  Social History Narrative  . Not on file   Social Determinants of Health   Financial Resource Strain:   . Difficulty of Paying Living Expenses: Not on file  Food Insecurity:   . Worried About Charity fundraiser in the Last Year: Not on file  . Ran Out of Food in the Last Year: Not on file  Transportation Needs:   . Lack of Transportation (Medical): Not on file  . Lack of Transportation (Non-Medical): Not on file  Physical Activity:   . Days of Exercise per Week: Not on file  . Minutes of Exercise per  Session: Not on file  Stress:   . Feeling of Stress : Not on file  Social Connections:   . Frequency of Communication with Friends and Family: Not on file  . Frequency of Social Gatherings with Friends and Family: Not on file  . Attends Religious Services: Not on file  . Active Member of Clubs or Organizations: Not on file  . Attends Archivist Meetings: Not on file  . Marital Status: Not on file  Intimate Partner Violence:   . Fear of Current or Ex-Partner: Not on file  . Emotionally Abused: Not on file  . Physically Abused: Not on file  . Sexually Abused: Not on file    Review of Systems  All other systems reviewed and are negative.   PHYSICAL EXAMINATION:    BP 118/76   Pulse 76   Ht 5' 5.75" (1.67 m)   Wt 177 lb (80.3 kg)   BMI 28.79 kg/m     General appearance: alert, cooperative and appears stated age   Pelvic US  Uterus no masses.  EMS 3.14 mm. IUD in endometrial canal.  Right ovarian follicle.  Left ovary normal.  No free fluid.   ASSESSMENT  Pelvic pain. Mirena IUD check up.  In normal position.  In for 6 years.  Menopausal symptoms.  Perimenopausal female.  Migraine without aura.   PLAN  Pelvic US findings and images reviewed. Check FSH. Consider increasing transdermal estrogen, switching to Estrace orally, or switch altogether to LoLoestrin depending on her [facility] level.  We talked about the benefits and risks of theses  Return for annual exam next week.

## 2020-05-15 LAB — FOLLICLE STIMULATING HORMONE: FSH: 10 m[IU]/mL

## 2020-05-21 NOTE — Progress Notes (Signed)
54 y.o. [id number] Married Caucasian female here for annual exam.    Patient had recent pelvic US done for pelvic pain, and this was normal.  She has a Mirena IUD in the correct position.  The discomfort is now resolved.   She has menopausal symptoms and her hormonal testing indicates an FSH of 10.0.  She is using Vivelle Dot.  She is not sure if it is working to control menopausal symptoms.  She would like to increase her patch.   She does indicate she is having more headaches.   She wants a refill of Macrobid for UTIs, which seem to have them on weekends or when travels.   Completed her Covid vaccination.   PCP:  Lamar Blinks, MD  No LMP recorded. (Menstrual status: IUD).           Sexually active: Yes.    The current method of family planning is vasectomy/Mirena 05-14-14.    Exercising: Yes.    walking and weights Smoker:  no  Health Maintenance: Pap: 01-31-18 Neg:Neg HR HPV, 11-14-14 Neg:Neg HR HPV, 10-18-13 Neg History of abnormal Pap:  Yes, 1996 Hx of cryotherapy to cervix MMG: 12-01-19 3D/Neg/density C/Birads1 Colonoscopy: 07-14-18 normal;next 10 years BMD:  n/a  Result  n/a TDaP:  10-18-13 Gardasil:   no HIV: Neg in pregnancy Hep C:never Screening Labs:  Today.    reports that she has never smoked. She has never used smokeless tobacco. She reports that she does not drink alcohol and does not use drugs.  Past Medical History:  Diagnosis Date  . Abnormal Pap smear of cervix 1996   Cryo  . Dysplasia of cervix 1996   cryo, no abn paps since  . Hypercholesteremia 2018   Patient denies  . Migraine    w/o aura  . Placenta accreta    Noted with last cesarean section 2007    Past Surgical History:  Procedure Laterality Date  . CESAREAN SECTION  03, 05, 07  . CRYOTHERAPY N/A 1996   cervix  . DILATION AND CURETTAGE OF UTERUS     times 2    Current Outpatient Medications  Medication Sig Dispense Refill  . AIMOVIG 70 MG/ML SOAJ 70 mg every 30 (thirty) days.  2  .  estradiol (VIVELLE-DOT) 0.075 MG/24HR Place 1 patch onto the skin 2 (two) times a week. 8 patch 0  . levonorgestrel (MIRENA) 20 MCG/24HR IUD 1 each by Intrauterine route once. Inserted 05/14/14    . naproxen (NAPROSYN) 500 MG tablet TAKE 1 TABLET AS NEEDED FOR HEADACHE UP TO TWICE A DAY . LIMIT USE TO 1 TO 2 DAYS/WK.  0  . SUMAtriptan (IMITREX) 100 MG tablet      No current facility-administered medications for this visit.    Family History  Problem Relation Age of Onset  . Heart attack Father 9       while in [place], bypass  . Diabetes Father   . Dementia Father   . Hypertension Mother   . Osteoporosis Mother   . Diabetes Maternal Grandfather        questionable  . Hypertension Maternal Grandmother   . Heart disease Maternal Grandmother        bypass  . Heart disease Paternal Grandfather   . Colon cancer Neg Hx   . Esophageal cancer Neg Hx   . Rectal cancer Neg Hx   . Stomach cancer Neg Hx     Review of Systems  All other systems reviewed and are negative.  Exam:   BP 128/74   Pulse 60   Resp 16   Ht 5\' 6"  (1.676 m)   Wt 170 lb 9.6 oz (77.4 kg)   BMI 27.54 kg/m     General appearance: alert, cooperative and appears stated age Head: normocephalic, without obvious abnormality, atraumatic Neck: no adenopathy, supple, symmetrical, trachea midline and thyroid normal to inspection and palpation Lungs: clear to auscultation bilaterally Breasts: normal appearance, no masses or tenderness, No nipple retraction or dimpling, No nipple discharge or bleeding, No axillary adenopathy Heart: regular rate and rhythm Abdomen: soft, non-tender; no masses, no organomegaly Extremities: extremities normal, atraumatic, no cyanosis or edema Skin: skin color, texture, turgor normal. No rashes or lesions Lymph nodes: cervical, supraclavicular, and axillary nodes normal. Neurologic: grossly normal  Pelvic: External genitalia:  no lesions              No abnormal inguinal nodes  palpated.              Urethra:  normal appearing urethra with no masses, tenderness or lesions              Bartholins and Skenes: normal                 Vagina: normal appearing vagina with normal color and discharge, no lesions              Cervix: no lesions.  IUD strings not sen.              Pap taken: No. Bimanual Exam:  Uterus:  normal size, contour, position, consistency, mobility, non-tender              Adnexa: no mass, fullness, tenderness              Rectal exam: Yes.  .  Confirms.              Anus:  normal sphincter tone, no lesions  Chaperone was present for exam.  Assessment:   Well woman visit with normal exam. Mirena IUD.  Strings not seen and IUD identified to be in normal position by pelvic US. Menopausal symptoms.  On ERT. Elevated LDL cholesterol.  Plan: Mammogram screening discussed. Self breast awareness reviewed. Pap and HR HPV as above. Guidelines for Calcium, Vitamin D, regular exercise program including cardiovascular and weight bearing exercise. Increase to Vivelle Dot 0.1 mg twice weekly.  #24, RF 3.  Routine labs today.  She may try omega 3 fatty acids and or red yeast rice to lower her LDL.  Follow up annually and prn.    After visit summary provided.

## 2020-05-22 ENCOUNTER — Ambulatory Visit (INDEPENDENT_AMBULATORY_CARE_PROVIDER_SITE_OTHER): Payer: No Typology Code available for payment source | Admitting: Obstetrics and Gynecology

## 2020-05-22 ENCOUNTER — Other Ambulatory Visit: Payer: Self-pay

## 2020-05-22 ENCOUNTER — Encounter: Payer: Self-pay | Admitting: Obstetrics and Gynecology

## 2020-05-22 VITALS — BP 128/74 | HR 60 | Resp 16 | Ht 66.0 in | Wt 170.6 lb

## 2020-05-22 DIAGNOSIS — Z01419 Encounter for gynecological examination (general) (routine) without abnormal findings: Secondary | ICD-10-CM | POA: Diagnosis not present

## 2020-05-22 MED ORDER — ESTRADIOL 0.1 MG/24HR TD PTTW: 1.0000 | MEDICATED_PATCH | TRANSDERMAL | 3 refills | Status: DC

## 2020-05-22 MED ORDER — NITROFURANTOIN MONOHYD MACRO 100 MG PO CAPS: 100.0000 mg | ORAL_CAPSULE | Freq: Two times a day (BID) | ORAL | 1 refills | Status: DC

## 2020-05-22 NOTE — Patient Instructions (Signed)

## 2020-05-23 LAB — COMPREHENSIVE METABOLIC PANEL
ALT: 15 IU/L (ref 0–32)
AST: 15 IU/L (ref 0–40)
Albumin/Globulin Ratio: 1.9 (ref 1.2–2.2)
Albumin: 4.3 g/dL (ref 3.8–4.9)
Alkaline Phosphatase: 61 IU/L (ref 48–121)
BUN/Creatinine Ratio: 15 (ref 9–23)
BUN: 11 mg/dL (ref 6–24)
Bilirubin Total: 0.6 mg/dL (ref 0.0–1.2)
CO2: 25 mmol/L (ref 20–29)
Calcium: 9.5 mg/dL (ref 8.7–10.2)
Chloride: 100 mmol/L (ref 96–106)
Creatinine, Ser: 0.71 mg/dL (ref 0.57–1.00)
GFR calc Af Amer: 113 mL/min/{1.73_m2} (ref >59)
GFR calc non Af Amer: 98 mL/min/{1.73_m2} (ref >59)
Globulin, Total: 2.3 g/dL (ref 1.5–4.5)
Glucose: 83 mg/dL (ref 65–99)
Potassium: 4.2 mmol/L (ref 3.5–5.2)
Sodium: 137 mmol/L (ref 134–144)
Total Protein: 6.6 g/dL (ref 6.0–8.5)

## 2020-05-23 LAB — LIPID PANEL
Chol/HDL Ratio: 4.4 ratio (ref 0.0–4.4)
Cholesterol, Total: 220 mg/dL — ABNORMAL HIGH (ref 100–199)
HDL: 50 mg/dL (ref >39)
LDL Chol Calc (NIH): 155 mg/dL — ABNORMAL HIGH (ref 0–99)
Triglycerides: 85 mg/dL (ref 0–149)
VLDL Cholesterol Cal: 15 mg/dL (ref 5–40)

## 2020-05-23 LAB — TSH: TSH: 0.766 u[IU]/mL (ref 0.450–4.500)

## 2020-05-23 LAB — CBC
Hematocrit: 43 % (ref 34.0–46.6)
Hemoglobin: 14.3 g/dL (ref 11.1–15.9)
MCH: 31.4 pg (ref 26.6–33.0)
MCHC: 33.3 g/dL (ref 31.5–35.7)
MCV: 95 fL (ref 79–97)
Platelets: 205 10*3/uL (ref 150–450)
RBC: 4.55 x10E6/uL (ref 3.77–5.28)
RDW: 12 % (ref 11.7–15.4)
WBC: 6.4 10*3/uL (ref 3.4–10.8)

## 2020-05-23 LAB — VITAMIN D 25 HYDROXY (VIT D DEFICIENCY, FRACTURES): Vit D, 25-Hydroxy: 25.5 ng/mL — ABNORMAL LOW (ref 30.0–100.0)

## 2020-06-09 ENCOUNTER — Encounter: Payer: Self-pay | Admitting: Obstetrics and Gynecology

## 2020-06-09 ENCOUNTER — Telehealth: Payer: Self-pay | Admitting: Obstetrics and Gynecology

## 2020-06-09 NOTE — Telephone Encounter (Signed)
My Communication update with patient.   She will come at 3:30 tomorrow afternoon.

## 2020-06-09 NOTE — Telephone Encounter (Signed)
Please see My Chart message from this weekend.   I recommend the patient come to the office tomorrow at 11:30 am for an appointment with me.

## 2020-06-10 ENCOUNTER — Ambulatory Visit (INDEPENDENT_AMBULATORY_CARE_PROVIDER_SITE_OTHER): Payer: No Typology Code available for payment source | Admitting: Obstetrics and Gynecology

## 2020-06-10 ENCOUNTER — Other Ambulatory Visit: Payer: Self-pay

## 2020-06-10 ENCOUNTER — Encounter: Payer: Self-pay | Admitting: Obstetrics and Gynecology

## 2020-06-10 VITALS — BP 138/82 | HR 66 | Temp 98.3°F | Ht 66.0 in | Wt 171.0 lb

## 2020-06-10 DIAGNOSIS — R102 Pelvic and perineal pain: Secondary | ICD-10-CM

## 2020-06-10 LAB — POCT URINALYSIS DIPSTICK
Bilirubin, UA: NEGATIVE
Blood, UA: NEGATIVE
Glucose, UA: NEGATIVE
Ketones, UA: NEGATIVE
Leukocytes, UA: NEGATIVE
Nitrite, UA: NEGATIVE
Protein, UA: NEGATIVE
Urobilinogen, UA: 0.2 E.U./dL
pH, UA: 5 (ref 5.0–8.0)

## 2020-06-10 NOTE — Telephone Encounter (Signed)
Reviewed MyChart message.  OV scheduled for 06/10/20 at 3:30pm.   Encounter closed.

## 2020-06-10 NOTE — Progress Notes (Signed)
GYNECOLOGY  VISIT   HPI: 54 y.o.   Married  Caucasian  female   [phone] with No LMP recorded. (Menstrual status: IUD).   here for pelvic pain.   Patient has low midline pelvic pain, onset 5 days ago.  Pain did lessen over a few days.  Took Ibuprofen 600 mg which was helpful. She feel urinary frequency at the same time.  Voiding did not relieve the pain.   She started Macrobid 5 days ago thinking it may be a UTI.    This pain is cyclic.  Occurred the end of July, end of August, and now the end of this month.   Her pelvic ultrasound 05/14/20 for investigation of the pain showed a small simple ovarian cyst and her IUD in a normal position.  No vaginal bleeding with her IUD, which was placed for treatment of abnormal uterine bleeding and history of adenomyosis suspected on prior ultrasound.   She stopped her transdermal estrogen 1.5 weeks ago which she is using to treat perimenopausal vasomotor symptoms.  No headache since she stopped estrogen.   She took birth control pills in the past without problems.  Urine Dip: Neg  GYNECOLOGIC HISTORY: No LMP recorded. (Menstrual status: IUD). Contraception: Vasectomy/Mirena IUD 05-14-14 Menopausal hormone therapy:  Vivelle Dot 0.1mg  Last mammogram:  12-01-19 3D/Neg/density C/Birads1 Last pap smear: 01-31-18 Neg:Neg HR HPV, 11-14-14 Neg:Neg HR HPV, 10-18-13 Neg         OB History    Gravida  5   Para  3   Term  2   Preterm  1   AB  2   Living  3     SAB  2   TAB      Ectopic      Multiple      Live Births  3              Patient Active Problem List   Diagnosis Date Noted   Hx of migraine headaches 03/03/2017   Dyslipidemia 03/03/2017    Past Medical History:  Diagnosis Date   Abnormal Pap smear of cervix 1996   Cryo   Dysplasia of cervix 1996   cryo, no abn paps since   Hypercholesteremia 2018   Patient denies   Migraine    w/o aura   Placenta accreta    Noted with last cesarean section 2007    Past  Surgical History:  Procedure Laterality Date   CESAREAN SECTION  03, 05, 07   CRYOTHERAPY N/A 1996   cervix   DILATION AND CURETTAGE OF UTERUS     times 2    Current Outpatient Medications  Medication Sig Dispense Refill   AIMOVIG 70 MG/ML SOAJ 70 mg every 30 (thirty) days.  2   levonorgestrel (MIRENA) 20 MCG/24HR IUD 1 each by Intrauterine route once. Inserted 05/14/14     naproxen (NAPROSYN) 500 MG tablet TAKE 1 TABLET AS NEEDED FOR HEADACHE UP TO TWICE A DAY . LIMIT USE TO 1 TO 2 DAYS/WK.  0   nitrofurantoin, macrocrystal-monohydrate, (MACROBID) 100 MG capsule Take 1 capsule (100 mg total) by mouth 2 (two) times daily. Take for 5 days as needed for urinary tract infection. 30 capsule 1   SUMAtriptan (IMITREX) 100 MG tablet      estradiol (VIVELLE-DOT) 0.1 MG/24HR patch Place 1 patch (0.1 mg total) onto the skin 2 (two) times a week. (Patient not taking: Reported on 06/10/2020) 24 patch 3   No current facility-administered medications for this visit.  ALLERGIES: Patient has no known allergies.  Family History  Problem Relation Age of Onset   Heart attack Father 65       while in [place], bypass   Diabetes Father    Dementia Father    Hypertension Mother    Osteoporosis Mother    Diabetes Maternal Grandfather        questionable   Hypertension Maternal Grandmother    Heart disease Maternal Grandmother        bypass   Heart disease Paternal Grandfather    Colon cancer Neg Hx    Esophageal cancer Neg Hx    Rectal cancer Neg Hx    Stomach cancer Neg Hx     Social History   Socioeconomic History   Marital status: Married    Spouse name: Not on file   Number of children: Not on file   Years of education: Not on file   Highest education level: Not on file  Occupational History   Not on file  Tobacco Use   Smoking status: Never Smoker   Smokeless tobacco: Never Used  Vaping Use   Vaping Use: Never used  Substance and Sexual  Activity   Alcohol use: No    Alcohol/week: 0.0 standard drinks    Comment: rarely   Drug use: No   Sexual activity: Yes    Partners: Male    Birth control/protection: Surgical, I.U.D.    Comment: vasectomy/ Mirena Inserted 05/14/14  Other Topics Concern   Not on file  Social History Narrative   Not on file   Social Determinants of Health   Financial Resource Strain:    Difficulty of Paying Living Expenses: Not on file  Food Insecurity:    Worried About [place] in the Last Year: Not on file   Ran Out of Food in the Last Year: Not on file  Transportation Needs:    Lack of Transportation (Medical): Not on file   Lack of Transportation (Non-Medical): Not on file  Physical Activity:    Days of Exercise per Week: Not on file   Minutes of Exercise per Session: Not on file  Stress:    Feeling of Stress : Not on file  Social Connections:    Frequency of Communication with Friends and Family: Not on file   Frequency of Social Gatherings with Friends and Family: Not on file   Attends Religious Services: Not on file   Active Member of Clubs or Organizations: Not on file   Attends Archivist Meetings: Not on file   Marital Status: Not on file  Intimate Partner Violence:    Fear of Current or Ex-Partner: Not on file   Emotionally Abused: Not on file   Physically Abused: Not on file   Sexually Abused: Not on file    Review of Systems  Genitourinary: Positive for pelvic pain.  All other systems reviewed and are negative.   PHYSICAL EXAMINATION:    BP 138/82    Pulse 66    Temp 98.3 F (36.8 C) (Oral)    Ht 5\' 6"  (1.676 m)    Wt 171 lb (77.6 kg)    BMI 27.60 kg/m     General appearance: alert, cooperative and appears stated age  Abdomen: soft, non-tender, no masses,  no organomegaly  Pelvic: External genitalia:  no lesions              Urethra:  normal appearing urethra with no masses, tenderness or lesions  Bartholins  and Skenes: normal                 Vagina: normal appearing vagina with normal color and discharge, no lesions              Cervix: no lesions. IUD strings not seen.                Bimanual Exam:  Uterus:  normal size, contour, position, consistency, mobility, non-tender              Adnexa: no mass, fullness, tenderness  Chaperone was present for exam.  ASSESSMENT  Cyclic pelvic pain.   I suspect that this is reproductive organ in origin and less likely related to bladder.  Mirena IUD treating abnormal uterine bleeding and possible adenomyosis on chart review.  Migraine HA without aura.   PLAN  We discussed options for treating her pain - adding ultra low dose combined oral contraception, adding Aygestin 5 mg daily, or removing her Mirena IUD.  Risks and benefits reviewed.  We did review risk of stroke, MI, DVT, and PE with use of combined oral contraception.  Warning signs of these discussed.  I do not favor removing the IUD as I suspect her pain may increase or her abnormal bleeding recur.  She will contact me back with her choice.

## 2020-06-10 NOTE — Patient Instructions (Signed)
Consider Loloestrin ultra low dose birth control or Aygestin (norethindrone) 5 mg daily to treat the pain.   Referral to a urologist is an option, but I would consider medical therapy first.   We can remove the IUD, but it is possible that abnormal bleeding or increased pain would occur.

## 2020-06-11 ENCOUNTER — Telehealth: Payer: Self-pay

## 2020-06-11 NOTE — Telephone Encounter (Signed)
Patient calling to let Dr Quincy Simmonds know which medication she has decided on.

## 2020-06-11 NOTE — Telephone Encounter (Signed)
Spoke with patient. Patient was seen in office on 06/10/20, calling to advise Dr. Quincy Simmonds she would like to try LoLoestrin. Confirmed pharmacy on file, request to be sent as 30 day supply. Patient has a Mirena IUD, would also like to confirm when to start OCP. Advised I will update Dr. Quincy Simmonds and f/u with recommendations, patient agreeable.   Rx pended for Lo Loestrin #28/2RF  Routing to Dr. Quincy Simmonds to advise.

## 2020-06-12 MED ORDER — LO LOESTRIN FE 1 MG-10 MCG / 10 MCG PO TABS: 1.0000 | ORAL_TABLET | Freq: Every day | ORAL | 2 refills | Status: DC

## 2020-06-12 NOTE — Telephone Encounter (Signed)
Lo Loestrin Rx to pharmacy.   Call placed to patient, left detailed message on mobile number, ok per dpr. Advised per Dr. Quincy Simmonds. Return call to office to schedule 3 mo f/u and if you have any additional questions. Advised may also want to try manufacture saving coupon for Lo Loestrin if not covered by plan.   Encounter closed.

## 2020-06-12 NOTE — Telephone Encounter (Signed)
Ok for [employer] for 3 total months.  She can start at any time and does not need to wait.  I would like to have her follow up with me in 3 months.

## 2020-08-12 ENCOUNTER — Telehealth: Payer: Self-pay

## 2020-08-12 NOTE — Telephone Encounter (Signed)
Patient has been having pelvic pain over the weekend. She is supposed to have a follow up with Dr. Quincy Simmonds this month but is wanting in this week. After taking her son back to college and driving last night.

## 2020-08-12 NOTE — Telephone Encounter (Signed)
Spoke with patient. Patient started LoLoestrin in 05/2020 for pelvic pain. Patient states OCP has been helping until this past weekend, symptoms returned. Pelvic pain has been intermittent, currently 4/10. Pain increased while driving in the car for a 1hr 15 min trip. Reports urinary urgency, "discomfort" is relieved when voiding. Denies any other urinary symptoms, fever/chills, N/V. Reports dark red blood when wiping for 2 days over the past 2 wks. Also has a mirena IUD in place. Requesting OV w/ Dr. Quincy Simmonds. No missed/late pills. Taking naproxen for pain, not effective.   OV scheduled for 11/30 at 3:30pm with Dr. Quincy Simmonds. Patient is agreeable to date and time.   Routing to provider for final review. Patient is agreeable to disposition. Will close encounter.

## 2020-08-13 ENCOUNTER — Ambulatory Visit (INDEPENDENT_AMBULATORY_CARE_PROVIDER_SITE_OTHER): Payer: No Typology Code available for payment source | Admitting: Obstetrics and Gynecology

## 2020-08-13 ENCOUNTER — Other Ambulatory Visit: Payer: Self-pay

## 2020-08-13 ENCOUNTER — Other Ambulatory Visit (HOSPITAL_COMMUNITY)
Admission: RE | Admit: 2020-08-13 | Discharge: 2020-08-13 | Disposition: A | Discharge status: Home / Self Care | Payer: No Typology Code available for payment source | Source: Ambulatory Visit | Attending: Obstetrics and Gynecology | Admitting: Obstetrics and Gynecology

## 2020-08-13 ENCOUNTER — Encounter: Payer: Self-pay | Admitting: Obstetrics and Gynecology

## 2020-08-13 VITALS — BP 136/84 | HR 8 | Ht 66.0 in | Wt 171.0 lb

## 2020-08-13 DIAGNOSIS — R3915 Urgency of urination: Secondary | ICD-10-CM | POA: Diagnosis not present

## 2020-08-13 DIAGNOSIS — R102 Pelvic and perineal pain: Secondary | ICD-10-CM | POA: Diagnosis not present

## 2020-08-13 DIAGNOSIS — R8290 Unspecified abnormal findings in urine: Secondary | ICD-10-CM

## 2020-08-13 LAB — POCT URINALYSIS DIPSTICK
Bilirubin, UA: NEGATIVE
Glucose, UA: NEGATIVE
Ketones, UA: NEGATIVE
Nitrite, UA: NEGATIVE
Protein, UA: POSITIVE — AB
Urobilinogen, UA: NEGATIVE E.U./dL — AB
pH, UA: 5 (ref 5.0–8.0)

## 2020-08-13 MED ORDER — NORETHINDRONE ACET-ETHINYL EST 1-20 MG-MCG PO TABS: 1.0000 | ORAL_TABLET | Freq: Every day | ORAL | 1 refills | Status: DC

## 2020-08-13 MED ORDER — SULFAMETHOXAZOLE-TRIMETHOPRIM 800-160 MG PO TABS: 1.0000 | ORAL_TABLET | Freq: Two times a day (BID) | ORAL | 0 refills | Status: DC

## 2020-08-13 NOTE — Progress Notes (Signed)
GYNECOLOGY  VISIT   HPI: 54 y.o.   Married  Caucasian  female   [phone] with No LMP recorded. (Menstrual status: IUD).   here for low pelvic pain x 5 days and some urinary urgency. Pain is significant when it occurs.  Voiding would relieve her symptoms.   The pain was not associated with bleeding.  She has had spotting for one day for 2 weeks in a row.  She has had cyclic pain at the end of the month in July and August.  This prompted a pelvic ultrasound on 05/14/20 which showed a normal uterus, Mirena IUD in endometrial canal. Ovaries with follicles, no free fluid.  Large PVR noted.   She was started on LoLoestrin in an attempt to control her pain, and she did not have pain for the first two months.  States not missed pills.   She did have a UTI 1.5 months ago and self treated with Macrobid she has at home. Her symptoms resolved.  She does have post coital UTIs.   Urine Dip: 1+WBCs, Pos.Protein  GYNECOLOGIC HISTORY: No LMP recorded. (Menstrual status: IUD). Contraception: Vasectomy/Mirena IUD 05-14-14/LoLo estrin.  Menopausal hormone therapy:  NA Last mammogram: 12-01-19 3D/Neg/density C/Birads1 Last pap smear: 01-31-18 Neg:Neg HR HPV, 11-14-14 Neg:Neg HR HPV, 10-18-13 Neg         OB History    Gravida  5   Para  3   Term  2   Preterm  1   AB  2   Living  3     SAB  2   TAB      Ectopic      Multiple      Live Births  3              Patient Active Problem List   Diagnosis Date Noted  . Hx of migraine headaches 03/03/2017  . Dyslipidemia 03/03/2017    Past Medical History:  Diagnosis Date  . Abnormal Pap smear of cervix 1996   Cryo  . Dysplasia of cervix 1996   cryo, no abn paps since  . Hypercholesteremia 2018   Patient denies  . Migraine    w/o aura  . Placenta accreta    Noted with last cesarean section 2007    Past Surgical History:  Procedure Laterality Date  . CESAREAN SECTION  03, 05, 07  . CRYOTHERAPY N/A 1996   cervix  . DILATION  AND CURETTAGE OF UTERUS     times 2    Current Outpatient Medications  Medication Sig Dispense Refill  . AIMOVIG 70 MG/ML SOAJ 70 mg every 30 (thirty) days.  2  . Cholecalciferol (VITAMIN D3) 25 MCG (1000 UT) CAPS     . levonorgestrel (MIRENA) 20 MCG/24HR IUD 1 each by Intrauterine route once. Inserted 05/14/14    . naproxen (NAPROSYN) 500 MG tablet TAKE 1 TABLET AS NEEDED FOR HEADACHE UP TO TWICE A DAY . LIMIT USE TO 1 TO 2 DAYS/WK.  0  . nitrofurantoin, macrocrystal-monohydrate, (MACROBID) 100 MG capsule Take 1 capsule (100 mg total) by mouth 2 (two) times daily. Take for 5 days as needed for urinary tract infection. 30 capsule 1  . Omega-3 Fatty Acids (FISH OIL) 1000 MG CPDR Take 1 capsule by mouth daily.    . Red Yeast Rice 600 MG CAPS     . SUMAtriptan (IMITREX) 100 MG tablet     . norethindrone-ethinyl estradiol (LOESTRIN) 1-20 MG-MCG tablet Take 1 tablet by mouth daily. 84 tablet  1  . sulfamethoxazole-trimethoprim (BACTRIM DS) 800-160 MG tablet Take 1 tablet by mouth 2 (two) times daily. One PO BID x 3 days 6 tablet 0   No current facility-administered medications for this visit.     ALLERGIES: Patient has no known allergies.  Family History  Problem Relation Age of Onset  . Heart attack Father 31       while in [place], bypass  . Diabetes Father   . Dementia Father   . Hypertension Mother   . Osteoporosis Mother   . Diabetes Maternal Grandfather        questionable  . Hypertension Maternal Grandmother   . Heart disease Maternal Grandmother        bypass  . Heart disease Paternal Grandfather   . Colon cancer Neg Hx   . Esophageal cancer Neg Hx   . Rectal cancer Neg Hx   . Stomach cancer Neg Hx     Social History   Socioeconomic History  . Marital status: Married    Spouse name: Not on file  . Number of children: Not on file  . Years of education: Not on file  . Highest education level: Not on file  Occupational History  . Not on file  Tobacco Use  .  Smoking status: Never Smoker  . Smokeless tobacco: Never Used  Vaping Use  . Vaping Use: Never used  Substance and Sexual Activity  . Alcohol use: No    Alcohol/week: 0.0 standard drinks    Comment: rarely  . Drug use: No  . Sexual activity: Yes    Partners: Male    Birth control/protection: Surgical, I.U.D.    Comment: vasectomy/ Mirena Inserted 05/14/14  Other Topics Concern  . Not on file  Social History Narrative  . Not on file   Social Determinants of Health   Financial Resource Strain:   . Difficulty of Paying Living Expenses: Not on file  Food Insecurity:   . Worried About Charity fundraiser in the Last Year: Not on file  . Ran Out of Food in the Last Year: Not on file  Transportation Needs:   . Lack of Transportation (Medical): Not on file  . Lack of Transportation (Non-Medical): Not on file  Physical Activity:   . Days of Exercise per Week: Not on file  . Minutes of Exercise per Session: Not on file  Stress:   . Feeling of Stress : Not on file  Social Connections:   . Frequency of Communication with Friends and Family: Not on file  . Frequency of Social Gatherings with Friends and Family: Not on file  . Attends Religious Services: Not on file  . Active Member of Clubs or Organizations: Not on file  . Attends Archivist Meetings: Not on file  . Marital Status: Not on file  Intimate Partner Violence:   . Fear of Current or Ex-Partner: Not on file  . Emotionally Abused: Not on file  . Physically Abused: Not on file  . Sexually Abused: Not on file    Review of Systems  Genitourinary: Positive for pelvic pain (low mid pelvic pain x 5 days) and urgency.  All other systems reviewed and are negative.   PHYSICAL EXAMINATION:    BP 136/84   Pulse (!) 8   Ht 5\' 6"  (1.676 m)   Wt 171 lb (77.6 kg)   SpO2 (!) 8%   BMI 27.60 kg/m     General appearance: alert, cooperative and appears stated  age   Pelvic: External genitalia:  no lesions               Urethra:  normal appearing urethra with no masses, tenderness or lesions              Bartholins and Skenes: normal                 Vagina: normal appearing vagina with normal color and discharge, no lesions              Cervix: no lesions                Bimanual Exam:  Uterus:  normal size, contour, position, consistency, mobility, non-tender              Adnexa: no mass, fullness, tenderness      Chaperone was present for exam.  ASSESSMENT  Cyclic pelvic pain.  Mirena IUD in 6th year of use. On combined oral contraception for pelvic pain control.  Postcoital UTIs. Hx placenta accreta.   PLAN  Urine micro and culture.  Start Bactrim DS po bid x 3 days.  [place] for future post coital Macrobid 100 mg x 1 prn intercourse.  She has Rx already at home.  Will switch from LoLoestrin to [employer].  May consider Mirena removal if pain does not resolve. Testing done today to GC/CT. She will let me know how she is doing on the new LoEstrin.  Fu for annual exam and prn.    35 minutes total time was spent for this patient encounter, including preparation, face-to-face counseling with the patient, coordination of care, and documentation of the encounter.

## 2020-08-14 LAB — URINALYSIS, MICROSCOPIC ONLY
Casts: NONE SEEN /lpf
RBC, Urine: NONE SEEN /hpf (ref 0–2)

## 2020-08-14 LAB — CERVICOVAGINAL ANCILLARY ONLY
Chlamydia: NEGATIVE
Comment: NEGATIVE
Comment: NORMAL
Neisseria Gonorrhea: NEGATIVE

## 2020-08-16 ENCOUNTER — Encounter: Payer: Self-pay | Admitting: Obstetrics and Gynecology

## 2020-08-17 LAB — URINE CULTURE

## 2020-08-18 ENCOUNTER — Other Ambulatory Visit: Payer: Self-pay | Admitting: Obstetrics and Gynecology

## 2020-08-19 ENCOUNTER — Encounter: Payer: Self-pay | Admitting: Obstetrics and Gynecology

## 2020-08-20 ENCOUNTER — Other Ambulatory Visit: Payer: Self-pay | Admitting: Obstetrics and Gynecology

## 2020-08-20 MED ORDER — SULFAMETHOXAZOLE-TRIMETHOPRIM 800-160 MG PO TABS: 1.0000 | ORAL_TABLET | Freq: Once | ORAL | 2 refills | Status: AC

## 2020-08-24 ENCOUNTER — Encounter: Payer: Self-pay | Admitting: Obstetrics and Gynecology

## 2020-08-24 ENCOUNTER — Other Ambulatory Visit: Payer: Self-pay | Admitting: Obstetrics and Gynecology

## 2020-08-24 MED ORDER — CIPROFLOXACIN HCL 500 MG PO TABS: 500.0000 mg | ORAL_TABLET | Freq: Two times a day (BID) | ORAL | 0 refills | Status: DC

## 2021-01-14 DIAGNOSIS — E559 Vitamin D deficiency, unspecified: Secondary | ICD-10-CM | POA: Insufficient documentation

## 2021-01-14 NOTE — Patient Instructions (Addendum)
It was great to see you again today, I will be in touch with your labs as soon as possible If need be we can always start a cholesterol med Please check your BP a few times over the next couple of weeks- goal less than 135/85. If you are consistently higher we can start a BP med Please do get a covid booster and consider the shingles series as well  Take care!    Health Maintenance, Female Adopting a healthy lifestyle and getting preventive care are important in promoting health and wellness. Ask your health care provider about:  The right schedule for you to have regular tests and exams.  Things you can do on your own to prevent diseases and keep yourself healthy. What should I know about diet, weight, and exercise? Eat a healthy diet  Eat a diet that includes plenty of vegetables, fruits, low-fat dairy products, and lean protein.  Do not eat a lot of foods that are high in solid fats, added sugars, or sodium.   Maintain a healthy weight Body mass index (BMI) is used to identify weight problems. It estimates body fat based on height and weight. Your health care provider can help determine your BMI and help you achieve or maintain a healthy weight. Get regular exercise Get regular exercise. This is one of the most important things you can do for your health. Most adults should:  Exercise for at least 150 minutes each week. The exercise should increase your heart rate and make you sweat (moderate-intensity exercise).  Do strengthening exercises at least twice a week. This is in addition to the moderate-intensity exercise.  Spend less time sitting. Even light physical activity can be beneficial. Watch cholesterol and blood lipids Have your blood tested for lipids and cholesterol at 53 years of age, then have this test every 5 years. Have your cholesterol levels checked more often if:  Your lipid or cholesterol levels are high.  You are older than 53 years of age.  You are at high  risk for heart disease. What should I know about cancer screening? Depending on your health history and family history, you may need to have cancer screening at various ages. This may include screening for:  Breast cancer.  Cervical cancer.  Colorectal cancer.  Skin cancer.  Lung cancer. What should I know about heart disease, diabetes, and high blood pressure? Blood pressure and heart disease  High blood pressure causes heart disease and increases the risk of stroke. This is more likely to develop in people who have high blood pressure readings, are of African descent, or are overweight.  Have your blood pressure checked: ? Every 3-5 years if you are 25-74 years of age. ? Every year if you are 25 years old or older. Diabetes Have regular diabetes screenings. This checks your fasting blood sugar level. Have the screening done:  Once every three years after age 45 if you are at a normal weight and have a low risk for diabetes.  More often and at a younger age if you are overweight or have a high risk for diabetes. What should I know about preventing infection? Hepatitis B If you have a higher risk for hepatitis B, you should be screened for this virus. Talk with your health care provider to find out if you are at risk for hepatitis B infection. Hepatitis C Testing is recommended for:  Everyone born from [birth year] through 1963.  Anyone with known risk factors for hepatitis C. Sexually transmitted  infections (STIs)  Get screened for STIs, including gonorrhea and chlamydia, if: ? You are sexually active and are younger than 53 years of age. ? You are older than 53 years of age and your health care provider tells you that you are at risk for this type of infection. ? Your sexual activity has changed since you were last screened, and you are at increased risk for chlamydia or gonorrhea. Ask your health care provider if you are at risk.  Ask your health care provider about whether you  are at high risk for HIV. Your health care provider may recommend a prescription medicine to help prevent HIV infection. If you choose to take medicine to prevent HIV, you should first get tested for HIV. You should then be tested every 3 months for as long as you are taking the medicine. Pregnancy  If you are about to stop having your period (premenopausal) and you may become pregnant, seek counseling before you get pregnant.  Take 400 to 800 micrograms (mcg) of folic acid every day if you become pregnant.  Ask for birth control (contraception) if you want to prevent pregnancy. Osteoporosis and menopause Osteoporosis is a disease in which the bones lose minerals and strength with aging. This can result in bone fractures. If you are 34 years old or older, or if you are at risk for osteoporosis and fractures, ask your health care provider if you should:  Be screened for bone loss.  Take a calcium or vitamin D supplement to lower your risk of fractures.  Be given hormone replacement therapy (HRT) to treat symptoms of menopause. Follow these instructions at home: Lifestyle  Do not use any products that contain nicotine or tobacco, such as cigarettes, e-cigarettes, and chewing tobacco. If you need help quitting, ask your health care provider.  Do not use street drugs.  Do not share needles.  Ask your health care provider for help if you need support or information about quitting drugs. Alcohol use  Do not drink alcohol if: ? Your health care provider tells you not to drink. ? You are pregnant, may be pregnant, or are planning to become pregnant.  If you drink alcohol: ? Limit how much you use to 0-1 drink a day. ? Limit intake if you are breastfeeding.  Be aware of how much alcohol is in your drink. In the [place] one drink equals one 12 oz bottle of beer (355 mL), one 5 oz glass of wine (148 mL), or one 1 oz glass of hard liquor (44 mL). General instructions  Schedule regular  health, dental, and eye exams.  Stay current with your vaccines.  Tell your health care provider if: ? You often feel depressed. ? You have ever been abused or do not feel safe at home. Summary  Adopting a healthy lifestyle and getting preventive care are important in promoting health and wellness.  Follow your health care provider's instructions about healthy diet, exercising, and getting tested or screened for diseases.  Follow your health care provider's instructions on monitoring your cholesterol and blood pressure. This information is not intended to replace advice given to you by your health care provider. Make sure you discuss any questions you have with your health care provider. Document Revised: 08/24/2018 Document Reviewed: 08/24/2018 Elsevier Patient Education  2021 [employer].

## 2021-01-14 NOTE — Progress Notes (Addendum)
[place] at [facility] [address] [place], [place] [address] [phone]  Date:  01/15/2021   Name:  [name]   DOB:  December 19, 1965   MRN:  [id number]  PCP:  Darreld Mclean, MD    Chief Complaint: Annual Exam (CPE)   History of Present Illness:  GAE BIHL is a 55 y.o. very pleasant female patient who presents with the following:  Here today for physical exam Generally in good health, history of migraine headache dyslipidemia, vitamin D deficiency, distant history of abnormal Pap in the 90s  Last seen by myself November 2020  She is a Software engineer, works in Programmer, systems.  3 teenage children; ages 34, 36 and 76- the oldest just finished up at [employer] He plans to study communications  Married to [place] who is also my patient  Hep C and HIV screening can be done Pap Smear- per GYN Mammogram- per GYN COVID-19 vaccine- done, she does not have her card on her.  She had just 1 dose of J&J so far, does need a booster which we discussed Shingrix- she does not want to start today but does plan to begin soon Colon cancer screen up-to-date Most recent labs on chart from September 2021  Her father is in hospice care right now -they are having a family meeting this afternoon, this is likely why her blood pressure is elevated  Her gynecologist is Dr. Quincy Simmonds  She does get regular exercise by walking/jogging a bit.  No chest pain or shortness of breath with exercise  BP Readings from Last 3 Encounters:  01/15/21 (!) 160/98  08/13/20 136/84  06/10/20 138/82    Patient Active Problem List   Diagnosis Date Noted  . Vitamin D deficiency 01/14/2021  . Hx of migraine headaches 03/03/2017  . Dyslipidemia 03/03/2017    Past Medical History:  Diagnosis Date  . Abnormal Pap smear of cervix 1996   Cryo  . Dysplasia of cervix 1996   cryo, no abn paps since  . Hypercholesteremia 2018   Patient denies  . Migraine    w/o aura   . Placenta accreta    Noted with last cesarean section 2007    Past Surgical History:  Procedure Laterality Date  . CESAREAN SECTION  03, 05, 07  . CRYOTHERAPY N/A 1996   cervix  . DILATION AND CURETTAGE OF UTERUS     times 2    Social History   Tobacco Use  . Smoking status: Never Smoker  . Smokeless tobacco: Never Used  Vaping Use  . Vaping Use: Never used  Substance Use Topics  . Alcohol use: No    Alcohol/week: 0.0 standard drinks    Comment: rarely  . Drug use: No    Family History  Problem Relation Age of Onset  . Heart attack Father 89       while in [place], bypass  . Diabetes Father   . Dementia Father   . Hypertension Mother   . Osteoporosis Mother   . Diabetes Maternal Grandfather        questionable  . Hypertension Maternal Grandmother   . Heart disease Maternal Grandmother        bypass  . Heart disease Paternal Grandfather   . Colon cancer Neg Hx   . Esophageal cancer Neg Hx   . Rectal cancer Neg Hx   . Stomach cancer Neg Hx     No Known Allergies  Medication list has been reviewed and updated.  Current Outpatient Medications on File Prior to Visit  Medication Sig Dispense Refill  . Cholecalciferol (VITAMIN D3) 25 MCG (1000 UT) CAPS     . levonorgestrel (MIRENA) 20 MCG/24HR IUD 1 each by Intrauterine route once. Inserted 05/14/14    . naproxen (NAPROSYN) 500 MG tablet TAKE 1 TABLET AS NEEDED FOR HEADACHE UP TO TWICE A DAY . LIMIT USE TO 1 TO 2 DAYS/WK.  0  . norethindrone-ethinyl estradiol (LOESTRIN) 1-20 MG-MCG tablet Take 1 tablet by mouth daily. 84 tablet 1  . Omega-3 Fatty Acids (FISH OIL) 1000 MG CPDR Take 1 capsule by mouth daily.    . SUMAtriptan (IMITREX) 100 MG tablet      No current facility-administered medications on file prior to visit.    Review of Systems:  As per HPI- otherwise negative.   Physical Examination: Vitals:   01/15/21 1125 01/15/21 1148  BP: (!) 170/100 (!) 160/98  Pulse: 72   Temp: 98.2 F (36.8  C)   SpO2: 99%    Vitals:   01/15/21 1125  Weight: 163 lb (73.9 kg)  Height: 5' 6.5" (1.689 m)   Body mass index is 25.91 kg/m. Ideal Body Weight: Weight in (lb) to have BMI = 25: 156.9  GEN: no acute distress.  Normal weight, looks well HEENT: Atraumatic, Normocephalic. Bilateral TM wnl, oropharynx normal.  PEERL,EOMI.   Ears and Nose: No external deformity. CV: RRR, No M/G/R. No JVD. No thrill. No extra heart sounds. PULM: CTA B, no wheezes, crackles, rhonchi. No retractions. No resp. distress. No accessory muscle use. ABD: S, NT, ND, +BS. No rebound. No HSM. EXTR: No c/c/e PSYCH: Normally interactive. Conversant.    Assessment and Plan: Physical exam  Vitamin D deficiency - Plan: VITAMIN D 25 Hydroxy (Vit-D Deficiency, Fractures)  Screening for thyroid disorder - Plan: TSH  Encounter for hepatitis C screening test for low risk patient - Plan: Hepatitis C antibody  Dyslipidemia - Plan: Lipid panel  Elevated BP without diagnosis of hypertension - Plan: CBC, Comprehensive metabolic panel  Screening for diabetes mellitus - Plan: Hemoglobin A1c  Seen today for physical exam Encouraged healthy diet and exercise routine Discussed immunizations and health maintenance Blood pressure elevated today, but this may be due to stress.  She is able to check her blood pressure at home, she will check her pressure several times over the next 1 to 2 weeks and update me.  If needed we can start medication This visit occurred during the SARS-CoV-2 public health emergency.  Safety protocols were in place, including screening questions prior to the visit, additional usage of staff PPE, and extensive cleaning of exam room while observing appropriate contact time as indicated for disinfecting solutions.    Signed Lamar Blinks, MD  Received her labs as below, message to patient  Results for orders placed or performed in visit on 01/15/21  CBC  Result Value Ref Range   WBC 7.2 4.0 -  10.5 K/uL   RBC 4.45 3.87 - 5.11 Mil/uL   Platelets 219.0 150.0 - 400.0 K/uL   Hemoglobin 14.2 12.0 - 15.0 g/dL   HCT 42.3 36.0 - 46.0 %   MCV 95.1 78.0 - 100.0 fl   MCHC 33.5 30.0 - 36.0 g/dL   RDW 13.2 11.5 - 15.5 %  Comprehensive metabolic panel  Result Value Ref Range   Sodium 138 135 - 145 mEq/L   Potassium 4.7 3.5 - 5.1 mEq/L   Chloride 103 96 -  112 mEq/L   CO2 28 19 - 32 mEq/L   Glucose, Bld 82 70 - 99 mg/dL   BUN 12 6 - 23 mg/dL   Creatinine, Ser 0.65 0.40 - 1.20 mg/dL   Total Bilirubin 0.7 0.2 - 1.2 mg/dL   Alkaline Phosphatase 39 39 - 117 U/L   AST 12 0 - 37 U/L   ALT 12 0 - 35 U/L   Total Protein 6.9 6.0 - 8.3 g/dL   Albumin 4.3 3.5 - 5.2 g/dL   GFR 100.75 >60.00 mL/min   Calcium 9.9 8.4 - 10.5 mg/dL  Hemoglobin A1c  Result Value Ref Range   Hgb A1c MFr Bld 5.8 4.6 - 6.5 %  Lipid panel  Result Value Ref Range   Cholesterol 227 (H) 0 - 200 mg/dL   Triglycerides 121.0 0.0 - 149.0 mg/dL   HDL 52.70 >39.00 mg/dL   VLDL 24.2 0.0 - 40.0 mg/dL   LDL Cholesterol 150 (H) 0 - 99 mg/dL   Total CHOL/HDL Ratio 4    NonHDL 174.38   TSH  Result Value Ref Range   TSH 1.15 0.35 - 4.50 uIU/mL  VITAMIN D 25 Hydroxy (Vit-D Deficiency, Fractures)  Result Value Ref Range   VITD 34.62 30.00 - 100.00 ng/mL

## 2021-01-15 ENCOUNTER — Encounter: Payer: Self-pay | Admitting: Family Medicine

## 2021-01-15 ENCOUNTER — Other Ambulatory Visit: Payer: Self-pay

## 2021-01-15 ENCOUNTER — Ambulatory Visit (INDEPENDENT_AMBULATORY_CARE_PROVIDER_SITE_OTHER): Payer: No Typology Code available for payment source | Admitting: Family Medicine

## 2021-01-15 ENCOUNTER — Other Ambulatory Visit: Payer: Self-pay | Admitting: Obstetrics and Gynecology

## 2021-01-15 VITALS — BP 160/98 | HR 72 | Temp 98.2°F | Ht 66.5 in | Wt 163.0 lb

## 2021-01-15 DIAGNOSIS — Z1231 Encounter for screening mammogram for malignant neoplasm of breast: Secondary | ICD-10-CM

## 2021-01-15 DIAGNOSIS — R030 Elevated blood-pressure reading, without diagnosis of hypertension: Secondary | ICD-10-CM | POA: Diagnosis not present

## 2021-01-15 DIAGNOSIS — Z1329 Encounter for screening for other suspected endocrine disorder: Secondary | ICD-10-CM

## 2021-01-15 DIAGNOSIS — E559 Vitamin D deficiency, unspecified: Secondary | ICD-10-CM

## 2021-01-15 DIAGNOSIS — Z1159 Encounter for screening for other viral diseases: Secondary | ICD-10-CM

## 2021-01-15 DIAGNOSIS — Z0000 Encounter for general adult medical examination without abnormal findings: Secondary | ICD-10-CM | POA: Diagnosis not present

## 2021-01-15 DIAGNOSIS — E785 Hyperlipidemia, unspecified: Secondary | ICD-10-CM

## 2021-01-15 DIAGNOSIS — Z131 Encounter for screening for diabetes mellitus: Secondary | ICD-10-CM | POA: Diagnosis not present

## 2021-01-15 LAB — LIPID PANEL
Cholesterol: 227 mg/dL — ABNORMAL HIGH (ref 0–200)
HDL: 52.7 mg/dL (ref >39.00)
LDL Cholesterol: 150 mg/dL — ABNORMAL HIGH (ref 0–99)
NonHDL: 174.38
Total CHOL/HDL Ratio: 4
Triglycerides: 121 mg/dL (ref 0.0–149.0)
VLDL: 24.2 mg/dL (ref 0.0–40.0)

## 2021-01-15 LAB — COMPREHENSIVE METABOLIC PANEL
ALT: 12 U/L (ref 0–35)
AST: 12 U/L (ref 0–37)
Albumin: 4.3 g/dL (ref 3.5–5.2)
Alkaline Phosphatase: 39 U/L (ref 39–117)
BUN: 12 mg/dL (ref 6–23)
CO2: 28 mEq/L (ref 19–32)
Calcium: 9.9 mg/dL (ref 8.4–10.5)
Chloride: 103 mEq/L (ref 96–112)
Creatinine, Ser: 0.65 mg/dL (ref 0.40–1.20)
GFR: 100.75 mL/min (ref >60.00)
Glucose, Bld: 82 mg/dL (ref 70–99)
Potassium: 4.7 mEq/L (ref 3.5–5.1)
Sodium: 138 mEq/L (ref 135–145)
Total Bilirubin: 0.7 mg/dL (ref 0.2–1.2)
Total Protein: 6.9 g/dL (ref 6.0–8.3)

## 2021-01-15 LAB — CBC
HCT: 42.3 % (ref 36.0–46.0)
Hemoglobin: 14.2 g/dL (ref 12.0–15.0)
MCHC: 33.5 g/dL (ref 30.0–36.0)
MCV: 95.1 fl (ref 78.0–100.0)
Platelets: 219 10*3/uL (ref 150.0–400.0)
RBC: 4.45 Mil/uL (ref 3.87–5.11)
RDW: 13.2 % (ref 11.5–15.5)
WBC: 7.2 10*3/uL (ref 4.0–10.5)

## 2021-01-15 LAB — TSH: TSH: 1.15 u[IU]/mL (ref 0.35–4.50)

## 2021-01-15 LAB — VITAMIN D 25 HYDROXY (VIT D DEFICIENCY, FRACTURES): VITD: 34.62 ng/mL (ref 30.00–100.00)

## 2021-01-15 LAB — HEMOGLOBIN A1C: Hgb A1c MFr Bld: 5.8 % (ref 4.6–6.5)

## 2021-01-16 LAB — HEPATITIS C ANTIBODY
Hepatitis C Ab: NONREACTIVE
SIGNAL TO CUT-OFF: 0 (ref <1.00)

## 2021-01-22 ENCOUNTER — Other Ambulatory Visit: Payer: Self-pay | Admitting: Obstetrics and Gynecology

## 2021-01-22 NOTE — Telephone Encounter (Signed)
Left message for patient to call with update on pills.

## 2021-01-23 NOTE — Telephone Encounter (Signed)
This Rx was prescribed at 08/13/2020 visit.  Dr. Quincy Simmonds wrote "She will let me know how she is doing on the new LoEstrin. "  Patient called back today to say she is doing fine on these pills and she would like to have it refilled.  Last AEX 05/22/20 AEX Scheduled 05/29/21 Mammo is scheduled 03/10/2021 Last Mammo 12/01/19

## 2021-02-27 ENCOUNTER — Encounter: Payer: No Typology Code available for payment source | Admitting: Family Medicine

## 2021-03-10 ENCOUNTER — Ambulatory Visit
Admission: RE | Admit: 2021-03-10 | Discharge: 2021-03-10 | Disposition: A | Discharge status: Home / Self Care | Payer: No Typology Code available for payment source | Source: Ambulatory Visit

## 2021-03-10 ENCOUNTER — Other Ambulatory Visit: Payer: Self-pay

## 2021-03-10 DIAGNOSIS — Z1231 Encounter for screening mammogram for malignant neoplasm of breast: Secondary | ICD-10-CM

## 2021-05-27 ENCOUNTER — Ambulatory Visit: Payer: No Typology Code available for payment source | Admitting: Obstetrics and Gynecology

## 2021-05-29 ENCOUNTER — Other Ambulatory Visit: Payer: Self-pay

## 2021-05-29 ENCOUNTER — Encounter: Payer: Self-pay | Admitting: Obstetrics and Gynecology

## 2021-05-29 ENCOUNTER — Ambulatory Visit (INDEPENDENT_AMBULATORY_CARE_PROVIDER_SITE_OTHER): Payer: No Typology Code available for payment source | Admitting: Obstetrics and Gynecology

## 2021-05-29 VITALS — BP 140/90 | HR 72 | Resp 16 | Ht 66.5 in | Wt 181.0 lb

## 2021-05-29 DIAGNOSIS — Z01419 Encounter for gynecological examination (general) (routine) without abnormal findings: Secondary | ICD-10-CM | POA: Diagnosis not present

## 2021-05-29 NOTE — Patient Instructions (Signed)

## 2021-05-29 NOTE — Progress Notes (Signed)
55 y.o. [id number] Married Caucasian female here for annual exam.    Patient has Mirena and is taking low dose COCs for control of pelvic pain.  No further pain since starting the COCs. Stopped the last week of August for a few days.  No missed pills.   Having more frequent headaches.   Hot all the time.   States her blood pressure is fluctuating a lot.  Elevated BP at PCP office but she was stressed that day.  Her blood pressure at home is 120 - 138/90.  Father passed from dementia in May.   PCP:   Lamar Blinks, MD   No LMP recorded. (Menstrual status: IUD).           Sexually active: Yes.    The current method of family planning is IUD.   Mirena placed on 05/14/14 Exercising: Yes.     walking Smoker:  no  Health Maintenance: Pap:  01-31-18 negative, HR HPV negative, 11-14-14 negative, HR HPV negative  History of abnormal Pap:  yes MMG:  03-10-21 density C/BIRADS 1 negative  Colonoscopy:  07-14-18 normal, f/u 10 years  BMD:   n/a  Result  n/a TDaP:  10-18-13  Gardasil:   no HIV: negative in pregnancy  Hep C: 01-15-21 negative  Screening Labs:  PCP Shingrix:  she will do.    reports that she has never smoked. She has never used smokeless tobacco. She reports that she does not drink alcohol and does not use drugs.  Past Medical History:  Diagnosis Date   Abnormal Pap smear of cervix 1996   Cryo   Dysplasia of cervix 1996   cryo, no abn paps since   Hypercholesteremia 2018   Patient denies   Migraine    w/o aura   Placenta accreta    Noted with last cesarean section 2007    Past Surgical History:  Procedure Laterality Date   CESAREAN SECTION  03, 05, 07   CRYOTHERAPY N/A 1996   cervix   DILATION AND CURETTAGE OF UTERUS     times 2    Current Outpatient Medications  Medication Sig Dispense Refill   Cholecalciferol (VITAMIN D3) 25 MCG (1000 UT) CAPS      Galcanezumab-gnlm (EMGALITY) 120 MG/ML SOAJ      levonorgestrel (MIRENA) 20 MCG/24HR IUD 1 each by  Intrauterine route once. Inserted 05/14/14     naproxen (NAPROSYN) 500 MG tablet TAKE 1 TABLET AS NEEDED FOR HEADACHE UP TO TWICE A DAY . LIMIT USE TO 1 TO 2 DAYS/WK.  0   Omega-3 Fatty Acids (FISH OIL) 1000 MG CPDR Take 1 capsule by mouth daily.     SUMAtriptan (IMITREX) 100 MG tablet      No current facility-administered medications for this visit.    Family History  Problem Relation Age of Onset   Hypertension Mother    Osteoporosis Mother    Heart attack Father 84       while in [place], bypass   Diabetes Father    Dementia Father    Hypertension Maternal Grandmother    Heart disease Maternal Grandmother        bypass   Diabetes Maternal Grandfather        questionable   Heart disease Paternal Grandfather    Colon cancer Neg Hx    Esophageal cancer Neg Hx    Rectal cancer Neg Hx    Stomach cancer Neg Hx     Review of Systems  All other systems  reviewed and are negative.  Exam:   BP 140/90 (BP Location: Right Arm, Patient Position: Sitting, Cuff Size: Normal)   Pulse 72   Resp 16   Ht 5' 6.5" (1.689 m)   Wt 181 lb (82.1 kg)   BMI 28.78 kg/m     General appearance: alert, cooperative and appears stated age Head: normocephalic, without obvious abnormality, atraumatic Neck: no adenopathy, supple, symmetrical, trachea midline and thyroid normal to inspection and palpation Lungs: clear to auscultation bilaterally Breasts: normal appearance, no masses or tenderness, No nipple retraction or dimpling, No nipple discharge or bleeding, No axillary adenopathy Heart: regular rate and rhythm Abdomen: soft, non-tender; no masses, no organomegaly Extremities: extremities normal, atraumatic, no cyanosis or edema Skin: skin color, texture, turgor normal. No rashes or lesions Lymph nodes: cervical, supraclavicular, and axillary nodes normal. Neurologic: grossly normal  Pelvic: External genitalia:  no lesions              No abnormal inguinal nodes palpated.               Urethra:  normal appearing urethra with no masses, tenderness or lesions              Bartholins and Skenes: normal                 Vagina: normal appearing vagina with normal color and discharge, no lesions              Cervix: no lesions.  IUD strings not seen.              Pap taken: no Bimanual Exam:  Uterus:  normal size, contour, position, consistency, mobility, non-tender              Adnexa: no mass, fullness, tenderness              Rectal exam: yes.  Confirms.              Anus:  normal sphincter tone, no lesions  Chaperone was present for exam:  Raquel Sarna, RN  Assessment:   Well woman visit with gynecologic exam. Mirena IUD.  Strings not seen and IUD identified to be in normal position by pelvic US. Cyclic pelvic pain.  Controlled on COCs. Increased heat.  Likely perimenopausal or postmenopausal. Elevated LDL cholesterol. Bereavement.   Plan: Mammogram screening discussed. Self breast awareness reviewed. Pap and HR HPV 2024.  Guidelines for Calcium, Vitamin D, regular exercise program including cardiovascular and weight bearing exercise. Stop COCs today.  If need to restart, would consider LoLoestrin.  Mirena is good for another year.  Support given for the loss of her father. Follow up annually and prn.    After visit summary provided.

## 2021-07-07 ENCOUNTER — Other Ambulatory Visit: Payer: Self-pay | Admitting: Obstetrics and Gynecology

## 2021-09-03 NOTE — Progress Notes (Deleted)
[place] at [facility] [address], [place], [place] [address] [phone]  Date:  09/11/2021   Name:  [name]   DOB:  June 10, 1966   MRN:  [id number]  PCP:  Darreld Mclean, MD    Chief Complaint: No chief complaint on file.   History of Present Illness:  [name] is a 55 y.o. very pleasant female patient who presents with the following:  Patient is seen today with concern about her blood pressure History of vitamin D deficiency, migraine headaches, dyslipidemia  Most recent visit with myself was in May of this year She also saw her gynecologist, Dr. 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  Covid series Shingirx Pap per GYN Flu vaccine  Labs done in May   Patient Active Problem List   Diagnosis Date Noted   Vitamin D deficiency 01/14/2021   Hx of migraine headaches 03/03/2017   Dyslipidemia 03/03/2017    Past Medical History:  Diagnosis Date   Abnormal Pap smear of cervix 1996   Cryo   Dysplasia of cervix 1996   cryo, no abn paps since   Hypercholesteremia 2018   Patient denies   Migraine    w/o aura   Placenta accreta    Noted with last cesarean section 2007    Past Surgical History:  Procedure Laterality Date   CESAREAN SECTION  03, 05, 07   CRYOTHERAPY N/A 1996   cervix   DILATION AND CURETTAGE OF UTERUS     times 2    Social History   Tobacco Use   Smoking status: Never   Smokeless tobacco: Never  Vaping Use   Vaping Use: Never used  Substance Use Topics   Alcohol use: No    Alcohol/week: 0.0 standard drinks    Comment: rarely   Drug use: No    Family History  Problem Relation Age of Onset   Hypertension Mother    Osteoporosis Mother    Heart attack Father 44       while in [place], bypass   Diabetes Father    Dementia Father    Hypertension Maternal Grandmother    Heart disease Maternal Grandmother        bypass   Diabetes Maternal  Grandfather        questionable   Heart disease Paternal Grandfather    Colon cancer Neg Hx    Esophageal cancer Neg Hx    Rectal cancer Neg Hx    Stomach cancer Neg Hx     No Known Allergies  Medication list has been reviewed and updated.  Current Outpatient Medications on File Prior to Visit  Medication Sig Dispense Refill   Cholecalciferol (VITAMIN D3) 25 MCG (1000 UT) CAPS      Galcanezumab-gnlm (EMGALITY) 120 MG/ML SOAJ      levonorgestrel (MIRENA) 20 MCG/24HR IUD 1 each by Intrauterine route once. Inserted 05/14/14     naproxen (NAPROSYN) 500 MG tablet TAKE 1 TABLET AS NEEDED FOR HEADACHE UP TO TWICE A DAY . LIMIT USE TO 1 TO 2 DAYS/WK.  0   Omega-3 Fatty Acids (FISH OIL) 1000 MG CPDR Take 1 capsule by mouth daily.     SUMAtriptan (IMITREX) 100 MG tablet      No current facility-administered medications on file prior to visit.    Review of Systems:  As per HPI- otherwise negative.   Physical Examination: There were  no vitals filed for this visit. There were no vitals filed for this visit. There is no height or weight on file to calculate BMI. Ideal Body Weight:    GEN: no acute distress. HEENT: Atraumatic, Normocephalic.  Ears and Nose: No external deformity. CV: RRR, No M/G/R. No JVD. No thrill. No extra heart sounds. PULM: CTA B, no wheezes, crackles, rhonchi. No retractions. No resp. distress. No accessory muscle use. ABD: S, NT, ND, +BS. No rebound. No HSM. EXTR: No c/c/e PSYCH: Normally interactive. Conversant.    Assessment and Plan: ***  Signed Lamar Blinks, MD

## 2021-09-11 ENCOUNTER — Ambulatory Visit: Payer: No Typology Code available for payment source | Admitting: Family Medicine

## 2021-09-16 NOTE — Progress Notes (Addendum)
[place] at [facility] [address], [place], [place] [address] [phone]  Date:  09/17/2021   Name:  [name]   DOB:  [birth year]   MRN:  [id number]  PCP:  Darreld Mclean, MD    Chief Complaint: blood pressure issues (She has been having some highs and lows. [name] has also been having pressure under the let breast at times. )   History of Present Illness:  [name] is a 56 y.o. very pleasant female patient who presents with the following:  Atlas is seen today for follow-up of blood pressure Most recent visit with myself was in May 2022  She is a Software engineer, works in [employer].  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  Her blood pressure has improved since stopping oral contraceptive pills.  However, more recent blood pressures continue to show diastolic readings sometimes around 90  She does have a family history of HTN   She has noted a pressure under her left breast intermittently -patient says it is not pain but just feels like an awareness.  It is actually present now She has noted this on rare occasion for a several years-  Over the last 2 months it has occurred more frequently- perhaps every couple of days It may last for several hours at a stretch or will come and go all day Not associated with exercise that she can determine-she does walk for exercise, has not noted any increased difficulty with exercise or any worsening of symptoms when she is exercising No SOB She does not seem to have a breast issue- she keeps up  with her mammo and has not noted any changes in her breasts   She does also note food getting stuck when she swallows perhaps twice a month- something like bread or pizza crust is the most likely culprit  Patient Active Problem List   Diagnosis Date Noted   Vitamin D deficiency 01/14/2021   Hx of migraine headaches 03/03/2017   Dyslipidemia 03/03/2017    Past Medical History:  Diagnosis Date   Abnormal Pap smear of cervix 1996   Cryo   Dysplasia of cervix 1996   cryo, no abn paps since   Hypercholesteremia 2018   Patient denies   Migraine    w/o aura   Placenta accreta    Noted with last cesarean section 2007    Past Surgical History:  Procedure Laterality Date   CESAREAN SECTION  03, 05, 07   CRYOTHERAPY N/A 1996   cervix   DILATION AND CURETTAGE OF UTERUS     times 2    Social History   Tobacco Use   Smoking status: Never   Smokeless tobacco: Never  Vaping Use   Vaping Use: Never used  Substance Use Topics   Alcohol use: No    Alcohol/week: 0.0 standard drinks    Comment: rarely   Drug use: No    Family History  Problem Relation Age of Onset   Hypertension Mother  Osteoporosis Mother    Heart attack Father 41       while in [place], bypass   Diabetes Father    Dementia Father    Hypertension Maternal Grandmother    Heart disease Maternal Grandmother        bypass   Diabetes Maternal Grandfather        questionable   Heart disease Paternal Grandfather    Colon cancer Neg Hx    Esophageal cancer Neg Hx    Rectal cancer Neg Hx    Stomach cancer Neg Hx     No Known Allergies  Medication list has been reviewed and updated.  Current Outpatient Medications on File Prior to Visit  Medication Sig Dispense Refill   AJOVY 225 MG/1.5ML SOAJ Inject into the skin.     Cholecalciferol (VITAMIN D3) 25 MCG (1000 UT) CAPS      levonorgestrel (MIRENA) 20 MCG/24HR IUD 1 each by Intrauterine route once. Inserted 05/14/14     naproxen (NAPROSYN) 500 MG  tablet TAKE 1 TABLET AS NEEDED FOR HEADACHE UP TO TWICE A DAY . LIMIT USE TO 1 TO 2 DAYS/WK.  0   Omega-3 Fatty Acids (FISH OIL) 1000 MG CPDR Take 1 capsule by mouth daily.     SUMAtriptan (IMITREX) 100 MG tablet      No current facility-administered medications on file prior to visit.    Review of Systems:  As per HPI- otherwise negative.   Physical Examination: Vitals:   09/17/21 1554  BP: 138/78  Pulse: 77  Resp: 18  Temp: 97.9 F (36.6 C)  SpO2: 99%   Vitals:   09/17/21 1554  Weight: 184 lb (83.5 kg)  Height: 5\' 7"  (1.702 m)   Body mass index is 28.82 kg/m. Ideal Body Weight: Weight in (lb) to have BMI = 25: 159.3  GEN: no acute distress.Overweight, looks well HEENT: Atraumatic, Normocephalic.  Ears and Nose: No external deformity. CV: RRR, No M/G/R. No JVD. No thrill. No extra heart sounds. PULM: CTA B, no wheezes, crackles, rhonchi. No retractions. No resp. distress. No accessory muscle use. ABD: S, NT, ND, +BS. No rebound. No HSM. EXTR: No c/c/e PSYCH: Normally interactive. Conversant.  Patient indicates the ribs on her left breast as the area of concern.  However, not able to worsen or reproduce pain by pressing on the area  EKG: wnl, one PAC.  Compared with EKG from 06/2019 no change is noted  Assessment and Plan: Left chest pressure - Plan: EKG 12-Lead, D-Dimer, Quantitative, Troponin I, DG Chest 2 View  Elevated BP without diagnosis of hypertension - Plan: EKG 12-Lead, CBC, Comprehensive metabolic panel  Screening for diabetes mellitus - Plan: Hemoglobin A1c  Here today with concern of borderline elevated blood pressure.  Discussed with her in detail.  At this time we can either start a low-dose antihypertensive or continue to observe.  She wishes to continue observation, is working on a lower salt diet, exercise and moderate weight loss Her blood pressure has improved since stopping the contraceptive pills-she still has her IUD in place If she notes her  diastolic readings consistently over 90 she will contact me and we can start medication  She also notes atypical chest discomfort/pressure-present for a few years but worse in the last 2 months.  EKG today is normal, but I advised patient this does not rule out ACS.  I offered to have her seen in the ER for further evaluation as most conservative option-she declines at this time  She did agree to having order a troponin stat, will also order a D-dimer and do a chest x-ray.  Assuming this work-up is negative we plan to do an outpatient stress test for risk stratification.  She does agree to seek care at the ER if she should get worse  I offered to have her see GI to discuss occasional difficulty swallowing, she declines for now and will monitor Signed Lamar Blinks, MD  Received chest film as below, message to patient  DG Chest 2 View  Result Date: 09/17/2021 CLINICAL DATA:  left sided chest pressure EXAM: CHEST - 2 VIEW COMPARISON:  None. FINDINGS: Mild coarsening of the lung markings. No consolidation. No visible pleural effusions or pneumothorax. Cardiomediastinal silhouette is within normal limits. No evidence of acute osseous abnormality. IMPRESSION: No evidence of acute cardiopulmonary disease. Electronically Signed   By: Margaretha Sheffield M.D.   On: 09/17/2021 16:53    Received her troponin, normal.  Message to patient Almon Register Myoview  Results for orders placed or performed in visit on 09/17/21  Troponin I  Result Value Ref Range   Troponin I <3 < OR = 47 ng/L   Addendum 1/5, received more lab results as below; message to patient that D-dimer is negative  Results for orders placed or performed in visit on 09/17/21  D-Dimer, Quantitative  Result Value Ref Range   D-Dimer, Quant 0.19 <0.50 mcg/mL FEU  Troponin I  Result Value Ref Range   Troponin I <3 < OR = 47 ng/L

## 2021-09-17 ENCOUNTER — Encounter: Payer: Self-pay | Admitting: Family Medicine

## 2021-09-17 ENCOUNTER — Other Ambulatory Visit: Payer: Self-pay

## 2021-09-17 ENCOUNTER — Other Ambulatory Visit: Payer: Self-pay | Admitting: Family Medicine

## 2021-09-17 ENCOUNTER — Ambulatory Visit (INDEPENDENT_AMBULATORY_CARE_PROVIDER_SITE_OTHER): Payer: No Typology Code available for payment source | Admitting: Family Medicine

## 2021-09-17 ENCOUNTER — Ambulatory Visit (HOSPITAL_BASED_OUTPATIENT_CLINIC_OR_DEPARTMENT_OTHER)
Admission: RE | Admit: 2021-09-17 | Discharge: 2021-09-17 | Disposition: A | Discharge status: Home / Self Care | Payer: No Typology Code available for payment source | Source: Ambulatory Visit | Attending: Family Medicine | Admitting: Family Medicine

## 2021-09-17 VITALS — BP 138/78 | HR 77 | Temp 97.9°F | Resp 18 | Ht 67.0 in | Wt 184.0 lb

## 2021-09-17 DIAGNOSIS — Z131 Encounter for screening for diabetes mellitus: Secondary | ICD-10-CM

## 2021-09-17 DIAGNOSIS — R0789 Other chest pain: Secondary | ICD-10-CM | POA: Insufficient documentation

## 2021-09-17 DIAGNOSIS — R030 Elevated blood-pressure reading, without diagnosis of hypertension: Secondary | ICD-10-CM | POA: Diagnosis not present

## 2021-09-17 DIAGNOSIS — R079 Chest pain, unspecified: Secondary | ICD-10-CM

## 2021-09-17 NOTE — Patient Instructions (Signed)
Good to see you today!  I will be in touch with your labs as soon as possible If your troponin is positive we will act on it immediately.  Assuming it is negative and your chest x-ray looks okay I suspect you are having chest wall pain.  However, I would suggest doing further evaluation with a stress test for your heart which we can certainly arrange  If things are getting worse please do seek care at the ER in the interim  Your blood pressure is borderline.  If you would like to continue observation right now I think that is fine, we can also start a very low-dose of blood pressure medication if you wish

## 2021-09-18 ENCOUNTER — Encounter: Payer: Self-pay | Admitting: Family Medicine

## 2021-09-18 DIAGNOSIS — R7303 Prediabetes: Secondary | ICD-10-CM | POA: Insufficient documentation

## 2021-09-18 LAB — COMPREHENSIVE METABOLIC PANEL WITH GFR
ALT: 22 U/L (ref 0–35)
AST: 19 U/L (ref 0–37)
Albumin: 4.4 g/dL (ref 3.5–5.2)
Alkaline Phosphatase: 63 U/L (ref 39–117)
BUN: 16 mg/dL (ref 6–23)
CO2: 28 meq/L (ref 19–32)
Calcium: 9.4 mg/dL (ref 8.4–10.5)
Chloride: 100 meq/L (ref 96–112)
Creatinine, Ser: 0.78 mg/dL (ref 0.40–1.20)
GFR: 86.51 mL/min (ref >60.00)
Glucose, Bld: 86 mg/dL (ref 70–99)
Potassium: 4 meq/L (ref 3.5–5.1)
Sodium: 136 meq/L (ref 135–145)
Total Bilirubin: 0.7 mg/dL (ref 0.2–1.2)
Total Protein: 6.9 g/dL (ref 6.0–8.3)

## 2021-09-18 LAB — TROPONIN I: Troponin I: <3 ng/L (ref <=47)

## 2021-09-18 LAB — D-DIMER, QUANTITATIVE: D-Dimer, Quant: 0.19 mcg/mL FEU (ref <0.50)

## 2021-09-18 LAB — CBC
HCT: 41.5 % (ref 36.0–46.0)
Hemoglobin: 13.7 g/dL (ref 12.0–15.0)
MCHC: 33 g/dL (ref 30.0–36.0)
MCV: 93.8 fl (ref 78.0–100.0)
Platelets: 202 10*3/uL (ref 150.0–400.0)
RBC: 4.43 Mil/uL (ref 3.87–5.11)
RDW: 12.9 % (ref 11.5–15.5)
WBC: 8.5 10*3/uL (ref 4.0–10.5)

## 2021-09-18 LAB — HEMOGLOBIN A1C: Hgb A1c MFr Bld: 5.9 % (ref 4.6–6.5)

## 2021-09-24 ENCOUNTER — Telehealth: Payer: Self-pay | Admitting: Family Medicine

## 2021-09-24 ENCOUNTER — Encounter: Payer: Self-pay | Admitting: Family Medicine

## 2021-09-24 DIAGNOSIS — R079 Chest pain, unspecified: Secondary | ICD-10-CM

## 2021-09-24 NOTE — Telephone Encounter (Signed)
I received a fax just today from [place] that her [place] I had ordered as a stat test for chest pain on 1/4- was denied and as such not done.  The patient had not alerted me about this either   I did a peer to peer and was told that the patient only qualifies for a treadmill test  I will update patient and see what she would like to do next

## 2021-10-08 DIAGNOSIS — O43219 Placenta accreta, unspecified trimester: Secondary | ICD-10-CM | POA: Insufficient documentation

## 2021-10-08 DIAGNOSIS — G43909 Migraine, unspecified, not intractable, without status migrainosus: Secondary | ICD-10-CM | POA: Insufficient documentation

## 2021-10-14 ENCOUNTER — Ambulatory Visit (INDEPENDENT_AMBULATORY_CARE_PROVIDER_SITE_OTHER): Payer: No Typology Code available for payment source | Admitting: Cardiology

## 2021-10-14 ENCOUNTER — Other Ambulatory Visit: Payer: Self-pay

## 2021-10-14 ENCOUNTER — Encounter: Payer: Self-pay | Admitting: Cardiology

## 2021-10-14 VITALS — BP 138/82 | HR 69 | Ht 67.0 in | Wt 177.1 lb

## 2021-10-14 DIAGNOSIS — E785 Hyperlipidemia, unspecified: Secondary | ICD-10-CM

## 2021-10-14 DIAGNOSIS — R0789 Other chest pain: Secondary | ICD-10-CM

## 2021-10-14 DIAGNOSIS — R079 Chest pain, unspecified: Secondary | ICD-10-CM | POA: Diagnosis not present

## 2021-10-14 DIAGNOSIS — R7303 Prediabetes: Secondary | ICD-10-CM

## 2021-10-14 MED ORDER — METOPROLOL TARTRATE 100 MG PO TABS: 100.0000 mg | ORAL_TABLET | Freq: Once | ORAL | 0 refills | Status: DC

## 2021-10-14 NOTE — Patient Instructions (Signed)
Medication Instructions:  Your physician recommends that you continue on your current medications as directed. Please refer to the Current Medication list given to you today.  *If you need a refill on your cardiac medications before your next appointment, please call your pharmacy*   Lab Work: Your physician recommends that you return for lab work in:  BMP 1 week before CT If you have labs (blood work) drawn today and your tests are completely normal, you will receive your results only by: [place] (if you have [place]) OR A paper copy in the mail If you have any lab test that is abnormal or we need to change your treatment, we will call you to review the results.   Testing/Procedures:   Your cardiac CT will be scheduled at one of the below locations:   [facility] [address] [place], [place] [address] [phone]  [place] [address] [place], [place] [address] [phone]  If scheduled at [facility], please arrive at the [facility] main entrance (entrance A) of [facility] 30 minutes prior to test start time. You can use the FREE valet parking offered at the main entrance (encouraged to control the heart rate for the test) Proceed to the [facility] Radiology Department (first floor) to check-in and test prep.  If scheduled at [facility], please arrive 15 mins early for check-in and test prep.  Please follow these instructions carefully (unless otherwise directed):  On the Night Before the Test: Be sure to Drink plenty of water. Do not consume any caffeinated/decaffeinated beverages or chocolate 12 hours prior to your test. Do not take any antihistamines 12 hours prior to your test.  On the Day of the Test: Drink plenty of water until 1 hour prior to the test. Do not eat any food 4 hours prior to the test. You may take your regular medications  prior to the test.  Take metoprolol (Lopressor) two hours prior to test. FEMALES- please wear underwire-free bra if available, avoid dresses & tight clothing       After the Test: Drink plenty of water. After receiving IV contrast, you may experience a mild flushed feeling. This is normal. On occasion, you may experience a mild rash up to 24 hours after the test. This is not dangerous. If this occurs, you can take Benadryl 25 mg and increase your fluid intake. If you experience trouble breathing, this can be serious. If it is severe call 911 IMMEDIATELY. If it is mild, please call our office. If you take any of these medications: Glipizide/Metformin, Avandament, Glucavance, please do not take 48 hours after completing test unless otherwise instructed.  We will call to schedule your test 2-4 weeks out understanding that some insurance companies will need an authorization prior to the service being performed.   For non-scheduling related questions, please contact the cardiac imaging nurse navigator should you have any questions/concerns: Marchia Bond, Cardiac Imaging Nurse Navigator Gordy Clement, Cardiac Imaging Nurse Navigator [facility] Direct Office Dial: [phone]   For scheduling needs, including cancellations and rescheduling, please call [place], [phone].    Follow-Up: At [facility], you and your health needs are our priority.  As part of our continuing mission to provide you with exceptional heart care, we have created designated Provider Care Teams.  These Care Teams include your primary Cardiologist (physician) and Advanced Practice Providers (APPs -  Physician Assistants  and Nurse Practitioners) who all work together to provide you with the care you need, when you need it.  We recommend signing up for the patient portal called "MyChart".  Sign up information is provided on this After Visit Summary.  MyChart is used to connect with  patients for Virtual Visits (Telemedicine).  Patients are able to view lab/test results, encounter notes, upcoming appointments, etc.  Non-urgent messages can be sent to your provider as well.   To learn more about what you can do with MyChart, go to [email or web address].    Your next appointment:   2 month(s)  The format for your next appointment:   In Person  Provider:   Jenne Campus, MD    Other Instructions None

## 2021-10-14 NOTE — Progress Notes (Signed)
Cardiology Consultation:    Date:  10/14/2021   ID:  [name], DOB 21-Jun-1966, MRN [id number]  PCP:  Jillian Mclean, MD  Cardiologist:  Jenne Campus, MD   Referring MD: Jillian Mclean, MD   No chief complaint on file. I have chest pain  History of Present Illness:    [name] is a 56 y.o. female who is being seen today for the evaluation of chest pain at the request of [name], Jillian Filler, MD. past medical history significant for dyslipidemia however her HDL is relatively high, she does have borderline diabetes for a year she has been experiencing some chest pain.  Is located in the left side of her chest she actually pinpoint below her left breast going in the middle of the chest top.  However lately this pain became more frequent.  This can last from a couple seconds to a couple minutes.  It can happen at any time.  Interestingly she is also very active she walks on the regular basis when she does not she does not have any chest pain.  She did not notice any decrease in her ability to exercise.  She recently saw her primary care physician who asked her to have a D-dimer done which was normal she also had troponin I done which was normal.  She comes today because she would like to find out what the pain is coming from.  On the physical exam I cannot reproduce the pain by pressing chest wall. She never smoked, she not on any special diet but try to exercise on the regular basis have no difficulty doing it. When she have chest pain taking deep breath coughing does not make any difference.  She graded pain 3 in scale up to 10. She does have family history of coronary artery disease but not premature actually I have an honor of taking care of her father for years.  Past Medical History:  Diagnosis Date   Abnormal Pap smear of cervix 1996   Cryo   Dysplasia of cervix 1996   cryo, no abn paps since   Hypercholesteremia 2018   Patient denies   Migraine    w/o aura    Placenta accreta    Noted with last cesarean section 2007    Past Surgical History:  Procedure Laterality Date   CESAREAN SECTION  03, 05, 07   CRYOTHERAPY N/A 1996   cervix   DILATION AND CURETTAGE OF UTERUS     times 2    Current Medications: Current Meds  Medication Sig   AJOVY 225 MG/1.5ML SOAJ Inject into the skin.   Cholecalciferol (VITAMIN D3) 25 MCG (1000 UT) CAPS    levonorgestrel (MIRENA) 20 MCG/24HR IUD 1 each by Intrauterine route once. Inserted 05/14/14   naproxen (NAPROSYN) 500 MG tablet TAKE 1 TABLET AS NEEDED FOR HEADACHE UP TO TWICE A DAY . LIMIT USE TO 1 TO 2 DAYS/WK.   Omega-3 Fatty Acids (FISH OIL) 1000 MG CPDR Take 1 capsule by mouth daily.   SUMAtriptan (IMITREX) 100 MG tablet      Allergies:   Patient has no known allergies.   Social History   Socioeconomic History   Marital status: Married    Spouse name: Not on file   Number of children: Not on file   Years of education: Not on file   Highest education level: Not on file  Occupational History   Not on file  Tobacco Use   Smoking  status: Never    Passive exposure: Never   Smokeless tobacco: Never  Vaping Use   Vaping Use: Never used  Substance and Sexual Activity   Alcohol use: No    Alcohol/week: 0.0 standard drinks    Comment: rarely   Drug use: No   Sexual activity: Yes    Partners: Male    Birth control/protection: Surgical, I.U.D.    Comment: vasectomy/ Mirena Inserted 05/14/14  Other Topics Concern   Not on file  Social History Narrative   Not on file   Social Determinants of Health   Financial Resource Strain: Not on file  Food Insecurity: Not on file  Transportation Needs: Not on file  Physical Activity: Not on file  Stress: Not on file  Social Connections: Not on file     Family History: The patient's family history includes Dementia in her father; Diabetes in her father and maternal grandfather; Heart attack (age of onset: 37) in her father; Heart disease in her  maternal grandmother and paternal grandfather; Hypertension in her maternal grandmother and mother; Osteoporosis in her mother. There is no history of Colon cancer, Esophageal cancer, Rectal cancer, or Stomach cancer. ROS:   Please see the history of present illness.    All 14 point review of systems negative except as described per history of present illness.  EKGs/Labs/Other Studies Reviewed:    The following studies were reviewed today:   EKG:  EKG is  ordered today.  The ekg ordered today demonstrates normal sinus rhythm, normal P interval, normal QS complex duration fulgent no ST segment changes  Recent Labs: 01/15/2021: TSH 1.15 09/17/2021: ALT 22; BUN 16; Creatinine, Ser 0.78; Hemoglobin 13.7; Platelets 202.0; Potassium 4.0; Sodium 136  Recent Lipid Panel    Component Value Date/Time   CHOL 227 (H) 01/15/2021 1153   CHOL 220 (H) 05/22/2020 1318   TRIG 121.0 01/15/2021 1153   HDL 52.70 01/15/2021 1153   HDL 50 05/22/2020 1318   CHOLHDL 4 01/15/2021 1153   VLDL 24.2 01/15/2021 1153   LDLCALC 150 (H) 01/15/2021 1153   LDLCALC 155 (H) 05/22/2020 1318    Physical Exam:    VS:  BP 138/82 (BP Location: Left Arm)    Pulse 69    Ht 5\' 7"  (1.702 m)    Wt 177 lb 1.9 oz (80.3 kg)    SpO2 98%    BMI 27.74 kg/m     Wt Readings from Last 3 Encounters:  10/14/21 177 lb 1.9 oz (80.3 kg)  09/17/21 184 lb (83.5 kg)  05/29/21 181 lb (82.1 kg)     GEN:  Well nourished, well developed in no acute distress HEENT: Normal NECK: No JVD; No carotid bruits LYMPHATICS: No lymphadenopathy CARDIAC: RRR, no murmurs, no rubs, no gallops RESPIRATORY:  Clear to auscultation without rales, wheezing or rhonchi  ABDOMEN: Soft, non-tender, non-distended MUSCULOSKELETAL:  No edema; No deformity  SKIN: Warm and dry NEUROLOGIC:  Alert and oriented x 3 PSYCHIATRIC:  Normal affect   ASSESSMENT:    1. Dyslipidemia   2. Atypical chest pain   3. Prediabetes    PLAN:    In order of problems listed  above:  Atypical chest pain we had a long discussion about what to do with the situation I think the best test for her will be coronary CT angio.  That he will allow this to see any degree of stenosis.  In the meantime I asked her to stop taking baby aspirin every single day not  to exert too much herself.  She was scheduled to have [place] but that was not approved by [facility].  I honestly think coronary CT angio angio will be better test for her. Dyslipidemia I did calculated her 10 years predicted risk for having heart problem it came 2.9 which is low.  Therefore, I will not put her on any statin therapy.  But if coronary CT angio shows some abnormality obviously that situation will change. Prediabetes.  Overall she is doing well from that point review of hemoglobin A1c was normal. We did talk about healthy lifestyle we did talk about need to exercise on the regular basis as well as good diet I did discuss basic of Mediterranean diet.   Medication Adjustments/Labs and Tests Ordered: Current medicines are reviewed at length with the patient today.  Concerns regarding medicines are outlined above.  No orders of the defined types were placed in this encounter.  No orders of the defined types were placed in this encounter.   Signed, Park Liter, MD, [facility]. 10/14/2021 3:11 PM    [place]

## 2021-10-28 ENCOUNTER — Telehealth (HOSPITAL_COMMUNITY): Payer: Self-pay | Admitting: *Deleted

## 2021-10-28 NOTE — Telephone Encounter (Signed)
Reaching out to patient to offer assistance regarding upcoming cardiac imaging study; pt verbalizes understanding of appt date/time, parking situation and where to check in, pre-test NPO status and medications ordered, and verified current allergies; name and call back number provided for further questions should they arise  Gordy Clement RN Navigator Cardiac Imaging [facility] [phone] office [phone] cell  Patient to take 100mg  metoprolol tartrate two hours prior to her cardiac CT scan. She is aware to arrive at 8:30am for her 9am scan.

## 2021-10-30 ENCOUNTER — Ambulatory Visit (HOSPITAL_COMMUNITY)
Admission: RE | Admit: 2021-10-30 | Discharge: 2021-10-30 | Disposition: A | Discharge status: Home / Self Care | Payer: No Typology Code available for payment source | Source: Ambulatory Visit | Attending: Cardiology | Admitting: Cardiology

## 2021-10-30 ENCOUNTER — Other Ambulatory Visit: Payer: Self-pay

## 2021-10-30 ENCOUNTER — Encounter (HOSPITAL_COMMUNITY): Payer: Self-pay

## 2021-10-30 DIAGNOSIS — R079 Chest pain, unspecified: Secondary | ICD-10-CM | POA: Diagnosis not present

## 2021-10-30 DIAGNOSIS — I2510 Atherosclerotic heart disease of native coronary artery without angina pectoris: Secondary | ICD-10-CM

## 2021-10-30 MED ORDER — NITROGLYCERIN 0.4 MG SL SUBL
0.8000 mg | SUBLINGUAL_TABLET | Freq: Once | SUBLINGUAL | Status: AC
  Administered 2021-10-30: 0.8 mg via SUBLINGUAL

## 2021-10-30 MED ORDER — NITROGLYCERIN 0.4 MG SL SUBL
SUBLINGUAL_TABLET | SUBLINGUAL | Status: AC
  Filled 2021-10-30: qty 2

## 2021-10-30 MED ORDER — IOHEXOL 350 MG/ML SOLN
95.0000 mL | Freq: Once | INTRAVENOUS | Status: AC | PRN
  Administered 2021-10-30: 95 mL via INTRAVENOUS

## 2021-12-05 ENCOUNTER — Other Ambulatory Visit: Payer: Self-pay

## 2021-12-05 ENCOUNTER — Ambulatory Visit (INDEPENDENT_AMBULATORY_CARE_PROVIDER_SITE_OTHER): Payer: No Typology Code available for payment source | Admitting: Cardiology

## 2021-12-05 ENCOUNTER — Encounter: Payer: Self-pay | Admitting: Cardiology

## 2021-12-05 VITALS — BP 128/92 | HR 64 | Ht 67.0 in | Wt 172.0 lb

## 2021-12-05 DIAGNOSIS — I2510 Atherosclerotic heart disease of native coronary artery without angina pectoris: Secondary | ICD-10-CM | POA: Diagnosis not present

## 2021-12-05 DIAGNOSIS — R0789 Other chest pain: Secondary | ICD-10-CM

## 2021-12-05 DIAGNOSIS — R7303 Prediabetes: Secondary | ICD-10-CM | POA: Diagnosis not present

## 2021-12-05 DIAGNOSIS — E7800 Pure hypercholesterolemia, unspecified: Secondary | ICD-10-CM | POA: Diagnosis not present

## 2021-12-05 MED ORDER — PRAVASTATIN SODIUM 20 MG PO TABS: 20.0000 mg | ORAL_TABLET | Freq: Every evening | ORAL | 3 refills | Status: DC

## 2021-12-05 MED ORDER — ASPIRIN EC 81 MG PO TBEC: 81.0000 mg | DELAYED_RELEASE_TABLET | Freq: Every day | ORAL | 3 refills | Status: AC

## 2021-12-05 NOTE — Addendum Note (Signed)
Addended by: Edwyna Shell I on: 12/05/2021 11:02 AM ? ? Modules accepted: Orders ? ?

## 2021-12-05 NOTE — Patient Instructions (Signed)
Medication Instructions:  ?Your physician has recommended you make the following change in your medication:  ? ?START: Aspirin 81 mg daily ?START: Pravastatin 20 mg daily ? ?*If you need a refill on your cardiac medications before your next appointment, please call your pharmacy* ? ? ?Lab Work: ?Your physician recommends that you return for lab work in:  ? ?Labs in 6 weeks: Lipid and LFT's  ? ?If you have labs (blood work) drawn today and your tests are completely normal, you will receive your results only by: ?MyChart Message (if you have MyChart) OR ?A paper copy in the mail ?If you have any lab test that is abnormal or we need to change your treatment, we will call you to review the results. ? ? ?Testing/Procedures: ?None ? ? ?Follow-Up: ?At [facility], you and your health needs are our priority.  As part of our continuing mission to provide you with exceptional heart care, we have created designated Provider Care Teams.  These Care Teams include your primary Cardiologist (physician) and Advanced Practice Providers (APPs -  Physician Assistants and Nurse Practitioners) who all work together to provide you with the care you need, when you need it. ? ?We recommend signing up for the patient portal called "MyChart".  Sign up information is provided on this After Visit Summary.  MyChart is used to connect with patients for Virtual Visits (Telemedicine).  Patients are able to view lab/test results, encounter notes, upcoming appointments, etc.  Non-urgent messages can be sent to your provider as well.   ?To learn more about what you can do with MyChart, go to [email or web address].   ? ?Your next appointment:   ?6 month(s) ? ?The format for your next appointment:   ?In Person ? ?Provider:   ?Jenne Campus, MD  ? ? ?Other Instructions ?None ? ?

## 2021-12-05 NOTE — Progress Notes (Signed)
?Cardiology Office Note:   ? ?Date:  12/05/2021  ? ?ID:  [name], DOB 06-22-1966, MRN [id number] ? ?PCP:  Darreld Mclean, MD  ?Cardiologist:  Jenne Campus, MD   ? ?Referring MD: Darreld Mclean, MD  ? ?Chief Complaint  ?Patient presents with  ? Follow-up  ?  CT results  ? ? ?History of Present Illness:   ? ?[name] is a 56 y.o. female with past medical history significant for prediabetes, dyslipidemia.  She was referred to [place] because of atypical chest pain.  Coronary CT angio performed she does have mild disease of proximal LAD but enough to be concerned.  That does not explain her symptoms.  Therefore, I recommend to start aspirin every day, we also initiate statin.  She is doing well she said the sensation she had in the left breast still happening but much less frequently.  It happened typically at rest.  She may have some gas entrapment in her colon over there.  She tried to be active and try to exercise on a regular basis. ? ?Past Medical History:  ?Diagnosis Date  ? Abnormal Pap smear of cervix 1996  ? Cryo  ? Dysplasia of cervix 1996  ? cryo, no abn paps since  ? Hypercholesteremia 2018  ? Patient denies  ? Migraine   ? w/o aura  ? Placenta accreta   ? Noted with last cesarean section 2007  ? ? ?Past Surgical History:  ?Procedure Laterality Date  ? CESAREAN SECTION  03, 05, 07  ? CRYOTHERAPY N/A 1996  ? cervix  ? DILATION AND CURETTAGE OF UTERUS    ? times 2  ? ? ?Current Medications: ?Current Meds  ?Medication Sig  ? AJOVY 225 MG/1.5ML SOAJ Inject 225 mg into the skin every 30 (thirty) days.  ? Cholecalciferol (VITAMIN D3) 25 MCG (1000 UT) CAPS Take 1 capsule by mouth daily.  ? levonorgestrel (MIRENA) 20 MCG/24HR IUD 1 each by Intrauterine route once. Inserted 05/14/14  ? naproxen (NAPROSYN) 500 MG tablet Take 500 mg by mouth 2 (two) times daily with a meal.  ? Omega-3 Fatty Acids (FISH OIL) 1000 MG CPDR Take 1 capsule by mouth daily.  ? SUMAtriptan (IMITREX) 100 MG tablet Take 100 mg by  mouth as needed for headache or migraine.  ? [DISCONTINUED] metoprolol tartrate (LOPRESSOR) 100 MG tablet Take 1 tablet (100 mg total) by mouth once for 1 dose. Please take 2 hours before CT  ?  ? ?Allergies:   Patient has no known allergies.  ? ?Social History  ? ?Socioeconomic History  ? Marital status: Married  ?  Spouse name: Not on file  ? Number of children: Not on file  ? Years of education: Not on file  ? Highest education level: Not on file  ?Occupational History  ? Not on file  ?Tobacco Use  ? Smoking status: Never  ?  Passive exposure: Never  ? Smokeless tobacco: Never  ?Vaping Use  ? Vaping Use: Never used  ?Substance and Sexual Activity  ? Alcohol use: No  ?  Alcohol/week: 0.0 standard drinks  ?  Comment: rarely  ? Drug use: No  ? Sexual activity: Yes  ?  Partners: Male  ?  Birth control/protection: Surgical, I.U.D.  ?  Comment: vasectomy/ Mirena Inserted 05/14/14  ?Other Topics Concern  ? Not on file  ?Social History Narrative  ? Not on file  ? ?Social Determinants of Health  ? ?Financial Resource Strain: Not on file  ?  Food Insecurity: Not on file  ?Transportation Needs: Not on file  ?Physical Activity: Not on file  ?Stress: Not on file  ?Social Connections: Not on file  ?  ? ?Family History: ?The patient's family history includes Dementia in her father; Diabetes in her father and maternal grandfather; Heart attack (age of onset: 66) in her father; Heart disease in her maternal grandmother and paternal grandfather; Hypertension in her maternal grandmother and mother; Osteoporosis in her mother. There is no history of Colon cancer, Esophageal cancer, Rectal cancer, or Stomach cancer. ?ROS:   ?Please see the history of present illness.    ?All 14 point review of systems negative except as described per history of present illness ? ?EKGs/Labs/Other Studies Reviewed:   ? ? ? ?Recent Labs: ?01/15/2021: TSH 1.15 ?09/17/2021: ALT 22; BUN 16; Creatinine, Ser 0.78; Hemoglobin 13.7; Platelets 202.0; Potassium 4.0;  Sodium 136  ?Recent Lipid Panel ?   ?Component Value Date/Time  ? CHOL 227 (H) 01/15/2021 1153  ? CHOL 220 (H) 05/22/2020 1318  ? TRIG 121.0 01/15/2021 1153  ? HDL 52.70 01/15/2021 1153  ? HDL 50 05/22/2020 1318  ? CHOLHDL 4 01/15/2021 1153  ? VLDL 24.2 01/15/2021 1153  ? LDLCALC 150 (H) 01/15/2021 1153  ? [place] 155 (H) 05/22/2020 1318  ? ? ?Physical Exam:   ? ?VS:  BP (!) 128/92 (BP Location: Right Arm, Patient Position: Sitting)   Pulse 64   Ht '5\' 7"'$  (1.702 m)   Wt 172 lb (78 kg)   SpO2 97%   BMI 26.94 kg/m?    ? ?Wt Readings from Last 3 Encounters:  ?12/05/21 172 lb (78 kg)  ?10/14/21 177 lb 1.9 oz (80.3 kg)  ?09/17/21 184 lb (83.5 kg)  ?  ? ?GEN:  Well nourished, well developed in no acute distress ?HEENT: Normal ?NECK: No JVD; No carotid bruits ?LYMPHATICS: No lymphadenopathy ?CARDIAC: RRR, no murmurs, no rubs, no gallops ?RESPIRATORY:  Clear to auscultation without rales, wheezing or rhonchi  ?ABDOMEN: Soft, non-tender, non-distended ?MUSCULOSKELETAL:  No edema; No deformity  ?SKIN: Warm and dry ?LOWER EXTREMITIES: no swelling ?NEUROLOGIC:  Alert and oriented x 3 ?PSYCHIATRIC:  Normal affect  ? ?ASSESSMENT:   ? ?1. Prediabetes   ?2. Coronary artery disease involving native coronary artery of native heart without angina pectoris   ?3. Atypical chest pain   ?4. Hypercholesteremia   ? ?PLAN:   ? ?In order of problems listed above: ? ?Abnormal coronary CT showing only mild disease involving LAD.  We had a long discussion about what to do with the situation.  I recommended starting aspirin every day also will initiate statin.  We caseload of the proximal first of the disease and prevent her from ever having an issue.  We did discuss issue of Mediterranean diet basic of it.  We did talk about need to exercise at least 5 times a week for 30 minutes moderate intensity exercise. ?Atypical chest pain which is not related to her heart.  Coronary artery disease not sufficiently significant to cause the problem.   Could be gas entrapment.  I recommend to try Gas-X to see if it helps. ?Dyslipidemia we did calculate her risk before however now when we know that she does have likely nonobstructive coronary artery disease recommendation is to start cholesterol-lowering therapy.  I will start her with pravastatin we will start 20 mg fasting lipid profile HDL he will be done 6 weeks ? ? ?Medication Adjustments/Labs and Tests Ordered: ?Current medicines are reviewed at  length with the patient today.  Concerns regarding medicines are outlined above.  ?No orders of the defined types were placed in this encounter. ? ?Medication changes: No orders of the defined types were placed in this encounter. ? ? ?Signed, ?Park Liter, MD, [facility] ?12/05/2021 10:47 AM    ?[place] ?

## 2022-02-19 LAB — HEPATIC FUNCTION PANEL
ALT: 15 IU/L (ref 0–32)
AST: 16 IU/L (ref 0–40)
Albumin: 4.5 g/dL (ref 3.8–4.9)
Alkaline Phosphatase: 75 IU/L (ref 44–121)
Bilirubin Total: 0.6 mg/dL (ref 0.0–1.2)
Bilirubin, Direct: 0.15 mg/dL (ref 0.00–0.40)
Total Protein: 7.1 g/dL (ref 6.0–8.5)

## 2022-02-19 LAB — LIPID PANEL
Chol/HDL Ratio: 3.5 ratio (ref 0.0–4.4)
Cholesterol, Total: 201 mg/dL — ABNORMAL HIGH (ref 100–199)
HDL: 57 mg/dL (ref >39)
LDL Chol Calc (NIH): 126 mg/dL — ABNORMAL HIGH (ref 0–99)
Triglycerides: 101 mg/dL (ref 0–149)
VLDL Cholesterol Cal: 18 mg/dL (ref 5–40)

## 2022-02-23 ENCOUNTER — Telehealth: Payer: Self-pay

## 2022-02-23 DIAGNOSIS — E785 Hyperlipidemia, unspecified: Secondary | ICD-10-CM

## 2022-02-23 MED ORDER — ROSUVASTATIN CALCIUM 20 MG PO TABS: 20.0000 mg | ORAL_TABLET | Freq: Every day | ORAL | 3 refills | Status: DC

## 2022-02-23 NOTE — Telephone Encounter (Signed)
Spoke with pt. Per Dr.Munley's note- Pt will change to Rosuvastatin '20mg'$  q d and have Lipid, AST, ALT in 6 weeks. Pt verbalized understanding and had no further questions. Rx sent to pharmacy.

## 2022-04-21 ENCOUNTER — Other Ambulatory Visit: Payer: Self-pay | Admitting: Obstetrics and Gynecology

## 2022-04-21 DIAGNOSIS — Z1231 Encounter for screening mammogram for malignant neoplasm of breast: Secondary | ICD-10-CM

## 2022-05-01 DIAGNOSIS — Z1231 Encounter for screening mammogram for malignant neoplasm of breast: Secondary | ICD-10-CM

## 2022-05-07 ENCOUNTER — Telehealth: Payer: Self-pay | Admitting: Cardiology

## 2022-05-07 NOTE — Telephone Encounter (Signed)
Advised that she does need repeat labs and that she does not need an appointment but to fast. Pt verbalized understanding and had no additional questions.

## 2022-05-07 NOTE — Telephone Encounter (Signed)
Patient states she is not sure if she was supposed to come in after 6 weeks of being on a new medication for labs.

## 2022-05-14 ENCOUNTER — Encounter: Payer: Self-pay | Admitting: Cardiology

## 2022-05-14 LAB — LIPID PANEL
Chol/HDL Ratio: 2.7 ratio (ref 0.0–4.4)
Cholesterol, Total: 131 mg/dL (ref 100–199)
HDL: 49 mg/dL (ref >39)
LDL Chol Calc (NIH): 70 mg/dL (ref 0–99)
Triglycerides: 57 mg/dL (ref 0–149)
VLDL Cholesterol Cal: 12 mg/dL (ref 5–40)

## 2022-05-14 LAB — AST: AST: 25 IU/L (ref 0–40)

## 2022-05-14 LAB — ALT: ALT: 33 IU/L — ABNORMAL HIGH (ref 0–32)

## 2022-05-15 ENCOUNTER — Other Ambulatory Visit: Payer: Self-pay | Admitting: Obstetrics and Gynecology

## 2022-05-15 DIAGNOSIS — Z1231 Encounter for screening mammogram for malignant neoplasm of breast: Secondary | ICD-10-CM

## 2022-05-19 ENCOUNTER — Telehealth: Payer: Self-pay

## 2022-05-19 DIAGNOSIS — R7989 Other specified abnormal findings of blood chemistry: Secondary | ICD-10-CM

## 2022-05-19 NOTE — Telephone Encounter (Signed)
Patient notified of results and recommendations, Lab order on file

## 2022-05-19 NOTE — Telephone Encounter (Signed)
-----   Message from Park Liter, MD sent at 05/14/2022 11:22 AM EDT ----- Cholesterol looks perfect.  Liver function test are minimally elevated if at all.  We do not do anything unless liver function test elevation is 3 times normal which will be more than 100 in the situation.  So what we need to do we need to repeat AST and ALT in about 1 month just to verify make sure everything stable

## 2022-05-27 ENCOUNTER — Ambulatory Visit
Admission: RE | Admit: 2022-05-27 | Discharge: 2022-05-27 | Disposition: A | Discharge status: Home / Self Care | Payer: No Typology Code available for payment source | Source: Ambulatory Visit

## 2022-05-27 DIAGNOSIS — Z1231 Encounter for screening mammogram for malignant neoplasm of breast: Secondary | ICD-10-CM

## 2022-06-02 NOTE — Progress Notes (Unsigned)
56 y.o. [id number] Married {Race/ethnicity:17218} female here for annual exam.    PCP:     No LMP recorded. (Menstrual status: IUD).           Sexually active: {yes no:314532}  The current method of family planning is IUD--Mirena 05/14/14.    Exercising: {yes no:314532}  {types:19826} Smoker:  no  Health Maintenance: Pap: 01-31-18 Neg:Neg HR HPV, 11-14-14 Neg:Neg HR HPV, 10-18-13 Neg History of abnormal Pap:  yes,  1996 hx cryotherapy to cervix MMG:  05-27-22 Neg/BiRads1 Colonoscopy:   07-14-18 normal, f/u 10 years  BMD:   n/a  Result  n/a TDaP:  10-18-13 Gardasil:   no HIV: Neg in past Hep C: 01-15-21 Neg Screening Labs:  Hb today: ***, Urine today: ***   reports that she has never smoked. She has never been exposed to tobacco smoke. She has never used smokeless tobacco. She reports that she does not drink alcohol and does not use drugs.  Past Medical History:  Diagnosis Date   Abnormal Pap smear of cervix 1996   Cryo   Dysplasia of cervix 1996   cryo, no abn paps since   Hypercholesteremia 2018   Patient denies   Migraine    w/o aura   Placenta accreta    Noted with last cesarean section 2007    Past Surgical History:  Procedure Laterality Date   CESAREAN SECTION  03, 05, 07   CRYOTHERAPY N/A 1996   cervix   DILATION AND CURETTAGE OF UTERUS     times 2    Current Outpatient Medications  Medication Sig Dispense Refill   AJOVY 225 MG/1.5ML SOAJ Inject 225 mg into the skin every 30 (thirty) days.     aspirin EC 81 MG tablet Take 1 tablet (81 mg total) by mouth daily. Swallow whole. 90 tablet 3   Cholecalciferol (VITAMIN D3) 25 MCG (1000 UT) CAPS Take 1 capsule by mouth daily.     levonorgestrel (MIRENA) 20 MCG/24HR IUD 1 each by Intrauterine route once. Inserted 05/14/14     naproxen (NAPROSYN) 500 MG tablet Take 500 mg by mouth 2 (two) times daily with a meal.  0   Omega-3 Fatty Acids (FISH OIL) 1000 MG CPDR Take 1 capsule by mouth daily.     rosuvastatin (CRESTOR) 20 MG  tablet Take 1 tablet (20 mg total) by mouth daily. 90 tablet 3   SUMAtriptan (IMITREX) 100 MG tablet Take 100 mg by mouth as needed for headache or migraine.     No current facility-administered medications for this visit.    Family History  Problem Relation Age of Onset   Hypertension Mother    Osteoporosis Mother    Heart attack Father 70       while in [place], bypass   Diabetes Father    Dementia Father    Hypertension Maternal Grandmother    Heart disease Maternal Grandmother        bypass   Diabetes Maternal Grandfather        questionable   Heart disease Paternal Grandfather    Colon cancer Neg Hx    Esophageal cancer Neg Hx    Rectal cancer Neg Hx    Stomach cancer Neg Hx     Review of Systems  Exam:   There were no vitals taken for this visit.    General appearance: alert, cooperative and appears stated age Head: normocephalic, without obvious abnormality, atraumatic Neck: no adenopathy, supple, symmetrical, trachea midline and thyroid normal to inspection  and palpation Lungs: clear to auscultation bilaterally Breasts: normal appearance, no masses or tenderness, No nipple retraction or dimpling, No nipple discharge or bleeding, No axillary adenopathy Heart: regular rate and rhythm Abdomen: soft, non-tender; no masses, no organomegaly Extremities: extremities normal, atraumatic, no cyanosis or edema Skin: skin color, texture, turgor normal. No rashes or lesions Lymph nodes: cervical, supraclavicular, and axillary nodes normal. Neurologic: grossly normal  Pelvic: External genitalia:  no lesions              No abnormal inguinal nodes palpated.              Urethra:  normal appearing urethra with no masses, tenderness or lesions              Bartholins and Skenes: normal                 Vagina: normal appearing vagina with normal color and discharge, no lesions              Cervix: no lesions              Pap taken: {yes no:314532} Bimanual Exam:  Uterus:   normal size, contour, position, consistency, mobility, non-tender              Adnexa: no mass, fullness, tenderness              Rectal exam: {yes no:314532}.  Confirms.              Anus:  normal sphincter tone, no lesions  Chaperone was present for exam:  ***  Assessment:   Well woman visit with gynecologic exam.   Plan: Mammogram screening discussed. Self breast awareness reviewed. Pap and HR HPV as above. Guidelines for Calcium, Vitamin D, regular exercise program including cardiovascular and weight bearing exercise.   Follow up annually and prn.   Additional counseling given.  {yes [id number]. _______ minutes face to face time of which over 50% was spent in counseling.    After visit summary provided.

## 2022-06-03 ENCOUNTER — Ambulatory Visit (INDEPENDENT_AMBULATORY_CARE_PROVIDER_SITE_OTHER): Payer: No Typology Code available for payment source | Admitting: Obstetrics and Gynecology

## 2022-06-03 ENCOUNTER — Encounter: Payer: Self-pay | Admitting: Obstetrics and Gynecology

## 2022-06-03 VITALS — BP 110/70 | HR 71 | Ht 66.25 in | Wt 169.0 lb

## 2022-06-03 DIAGNOSIS — Z01419 Encounter for gynecological examination (general) (routine) without abnormal findings: Secondary | ICD-10-CM

## 2022-06-03 DIAGNOSIS — N951 Menopausal and female climacteric states: Secondary | ICD-10-CM

## 2022-06-03 MED ORDER — MISOPROSTOL 200 MCG PO TABS: ORAL_TABLET | ORAL | 0 refills | Status: DC

## 2022-06-03 NOTE — Patient Instructions (Signed)

## 2022-06-04 LAB — FOLLICLE STIMULATING HORMONE: FSH: 3.3 m[IU]/mL

## 2022-06-04 LAB — ESTRADIOL: Estradiol: 128 pg/mL

## 2022-06-09 ENCOUNTER — Telehealth: Payer: Self-pay | Admitting: *Deleted

## 2022-06-09 NOTE — Telephone Encounter (Signed)
Patient scheduled for IUD removal/insert on 06/16/22. She asked at her age of 36 if she would be able to keep IUD for the whole 5 years due to her age? She forgot to ask this question at office visit. Please advise

## 2022-06-09 NOTE — Telephone Encounter (Signed)
Patient informed. She asked I send response in my chart message.

## 2022-06-09 NOTE — Telephone Encounter (Signed)
A Mirena IUD is functional for a total of 8 years.  It may be used as the progesterone portion of hormonal replacement therapy if she desires this at some point.  If she does not need it for HRT once menopause has been confirmed, I would recommend removal at that point.  We can test for menopause through blood work, just as we did this time.

## 2022-06-11 NOTE — Progress Notes (Signed)
GYNECOLOGY  VISIT   HPI: 56 y.o.   Married  Caucasian  female   [phone] with Patient's last menstrual period was 06/14/2022.   here for mirena iud exchange.  FSH 3.3 and estradiol 128 on 06/03/22.   Took 2 doses of Cytotec.  GYNECOLOGIC HISTORY: Patient's last menstrual period was 06/14/2022. Contraception:  mirena iud inserted 05-14-14, vasectomy Menopausal hormone therapy:  none Last mammogram:  05-27-22 category c density birads 1:neg Last pap smear:   01-31-18 neg HPV HR  Neg UPT:neg        OB History     Gravida  5   Para  3   Term  2   Preterm  1   AB  2   Living  3      SAB  2   IAB      Ectopic      Multiple      Live Births  3              Patient Active Problem List   Diagnosis Date Noted   Coronary artery disease 25 to 49% stenosis of LAD based on coronary CT angio in 2023 12/05/2021   Atypical chest pain 10/14/2021   Placenta accreta 10/08/2021   Migraine 10/08/2021   Prediabetes 09/18/2021   Vitamin D deficiency 01/14/2021   Hx of migraine headaches 03/03/2017   Hypercholesteremia 2018   Dysplasia of cervix 1996   Abnormal Pap smear of cervix 1996    Past Medical History:  Diagnosis Date   Abnormal Pap smear of cervix 1996   Cryo   Dysplasia of cervix 1996   cryo, no abn paps since   Hypercholesteremia 2018   Patient denies   Migraine    w/o aura   Placenta accreta    Noted with last cesarean section 2007    Past Surgical History:  Procedure Laterality Date   CESAREAN SECTION  03, 05, 07   CRYOTHERAPY N/A 1996   cervix   DILATION AND CURETTAGE OF UTERUS     times 2    Current Outpatient Medications  Medication Sig Dispense Refill   aspirin EC 81 MG tablet Take 1 tablet (81 mg total) by mouth daily. Swallow whole. 90 tablet 3   Cholecalciferol (VITAMIN D3) 25 MCG (1000 UT) CAPS Take 1 capsule by mouth daily.     EMGALITY 120 MG/ML SOAJ Inject into the skin.     levonorgestrel (MIRENA) 20 MCG/24HR IUD 1 each by  Intrauterine route once. Inserted 05/14/14     misoprostol (CYTOTEC) 200 MCG tablet Place one tablet (200 mcg) per vagina at hs the night before IUD removal and then place one tablet (200 mcg) per vagina the am of the IUD removal. 2 tablet 0   naproxen (NAPROSYN) 500 MG tablet Take 500 mg by mouth 2 (two) times daily with a meal.  0   rosuvastatin (CRESTOR) 20 MG tablet Take 1 tablet (20 mg total) by mouth daily. 90 tablet 3   SUMAtriptan (IMITREX) 100 MG tablet 1 tablet as needed Orally     No current facility-administered medications for this visit.     ALLERGIES: Patient has no known allergies.  Family History  Problem Relation Age of Onset   Hypertension Mother    Osteoporosis Mother    Heart attack Father 63       while in [place], bypass   Diabetes Father    Dementia Father    Hypertension Maternal Grandmother  Heart disease Maternal Grandmother        bypass   Diabetes Maternal Grandfather        questionable   Heart disease Paternal Grandfather    Colon cancer Neg Hx    Esophageal cancer Neg Hx    Rectal cancer Neg Hx    Stomach cancer Neg Hx     Social History   Socioeconomic History   Marital status: Married    Spouse name: Not on file   Number of children: Not on file   Years of education: Not on file   Highest education level: Not on file  Occupational History   Not on file  Tobacco Use   Smoking status: Never    Passive exposure: Never   Smokeless tobacco: Never  Vaping Use   Vaping Use: Never used  Substance and Sexual Activity   Alcohol use: No   Drug use: No   Sexual activity: Yes    Partners: Male    Birth control/protection: Surgical, I.U.D.    Comment: vasectomy/ Mirena Inserted 05/14/14  Other Topics Concern   Not on file  Social History Narrative   Not on file   Social Determinants of Health   Financial Resource Strain: Not on file  Food Insecurity: Not on file  Transportation Needs: Not on file  Physical Activity: Not on file   Stress: Not on file  Social Connections: Not on file  Intimate Partner Violence: Not on file    Review of Systems  Constitutional: Negative.   HENT: Negative.    Eyes: Negative.   Respiratory: Negative.    Cardiovascular: Negative.   Gastrointestinal: Negative.   Endocrine: Negative.   Genitourinary: Negative.   Musculoskeletal: Negative.   Skin: Negative.   Allergic/Immunologic: Negative.   Neurological: Negative.   Hematological: Negative.   Psychiatric/Behavioral: Negative.      PHYSICAL EXAMINATION:    BP (!) 138/98   Pulse 74   Resp 16   Wt 166 lb (75.3 kg)   LMP 06/14/2022   BMI 26.59 kg/m     General appearance: alert, cooperative and appears stated age  Pelvic: External genitalia:  no lesions              Urethra:  normal appearing urethra with no masses, tenderness or lesions              Bartholins and Skenes: normal                 Vagina: normal appearing vagina with normal color and discharge, no lesions              Cervix: no lesions                Bimanual Exam:  Uterus:  normal size, contour, position, consistency, mobility, non-tender              Adnexa: no mass, fullness, tenderness            IUD exchange.  New Mirena IUD - lot [id number], expiration Oct 2025.  Consent for procedures.  Cytotec removed from vagina.  Sterile prep with betadine.  Paracervical block 10 cc 1% lidocaine, lot [id number], exp 09/15/23. Tenaculum to anterior cervical lip.  Cervical stenosis noted.  IUD strings not visible. Cervix dilated with small dilators. Os finder used.  IUD grasper used to remove IUD intact, shown to patient and discarded. Uterus sounded to just over 8 cm.  Mirena IUD placed without difficulty.  Strings trimmed and shown  to patient.  No complications.  Minimal EBL.  Repeat bimanual exam, no change.   Chaperone was present for exam:  Joy, Martin IUD exchange.  Cervical stenosis.   PLAN  Post IUD precautions to patient.   New IUD card to patient.  FU in 4 weeks.    An After Visit Summary was printed and given to the patient.

## 2022-06-12 LAB — BASIC METABOLIC PANEL
BUN/Creatinine Ratio: 14 (ref 9–23)
BUN: 11 mg/dL (ref 6–24)
CO2: 22 mmol/L (ref 20–29)
Calcium: 9.9 mg/dL (ref 8.7–10.2)
Chloride: 102 mmol/L (ref 96–106)
Creatinine, Ser: 0.79 mg/dL (ref 0.57–1.00)
Glucose: 96 mg/dL (ref 70–99)
Potassium: 5.1 mmol/L (ref 3.5–5.2)
Sodium: 140 mmol/L (ref 134–144)
eGFR: 89 mL/min/{1.73_m2} (ref >59)

## 2022-06-15 ENCOUNTER — Other Ambulatory Visit: Payer: Self-pay | Admitting: *Deleted

## 2022-06-15 ENCOUNTER — Telehealth: Payer: Self-pay | Admitting: Cardiology

## 2022-06-15 ENCOUNTER — Other Ambulatory Visit: Payer: Self-pay

## 2022-06-15 DIAGNOSIS — Z30430 Encounter for insertion of intrauterine contraceptive device: Secondary | ICD-10-CM

## 2022-06-15 DIAGNOSIS — R7989 Other specified abnormal findings of blood chemistry: Secondary | ICD-10-CM

## 2022-06-15 NOTE — Telephone Encounter (Signed)
Called patient and informed her that the ALT and AST were not drawn when she had her BMET drawn by the phlebotomist. Patient stated she had an appointment with Dr. Agustin Cree on Thursday and she would come into the office a few days before her appointment and have the ALT and AST drawn so the results would be available for her appointment. Patient had no further questions at this time.

## 2022-06-15 NOTE — Telephone Encounter (Signed)
Patient is following up on lab tests.  Patient stated the lab only collected for a BMET panel last week and she believes the should have also collected for ALT and AST tests.

## 2022-06-16 ENCOUNTER — Ambulatory Visit (INDEPENDENT_AMBULATORY_CARE_PROVIDER_SITE_OTHER): Payer: No Typology Code available for payment source | Admitting: Obstetrics and Gynecology

## 2022-06-16 ENCOUNTER — Encounter: Payer: Self-pay | Admitting: Obstetrics and Gynecology

## 2022-06-16 VITALS — BP 138/98 | HR 74 | Resp 16 | Wt 166.0 lb

## 2022-06-16 DIAGNOSIS — N882 Stricture and stenosis of cervix uteri: Secondary | ICD-10-CM

## 2022-06-16 DIAGNOSIS — Z30432 Encounter for removal of intrauterine contraceptive device: Secondary | ICD-10-CM | POA: Diagnosis not present

## 2022-06-16 DIAGNOSIS — Z30430 Encounter for insertion of intrauterine contraceptive device: Secondary | ICD-10-CM

## 2022-06-16 DIAGNOSIS — Z01812 Encounter for preprocedural laboratory examination: Secondary | ICD-10-CM

## 2022-06-16 LAB — AST: AST: 24 IU/L (ref 0–40)

## 2022-06-16 LAB — ALT: ALT: 30 IU/L (ref 0–32)

## 2022-06-16 LAB — PREGNANCY, URINE: Preg Test, Ur: NEGATIVE

## 2022-06-16 NOTE — Patient Instructions (Signed)
Intrauterine Device Insertion An intrauterine device (IUD) is a medical device that is inserted into the uterus to prevent pregnancy. It is a small, T-shaped device that has one or two nylon strings hanging down from it. The strings hang out of the lower part of the uterus (cervix) to allow for future IUD removal. There are two types of IUDs: Hormone IUD. This type of IUD is made of plastic and contains the hormone progestin (synthetic progesterone). A hormone IUD may last 3-5 years, depending on which one you have. Synthetic progesterone prevents pregnancy by: Thickening cervical mucus to prevent sperm from entering the uterus. Thinning the uterine lining to prevent a fertilized egg from implanting there. Copper IUD. This type of IUD has copper wire wrapped around it. A copper IUD may last up to 10 years. Copper prevents pregnancy by making the uterus and fallopian tubes produce a fluid that kills sperm. Tell a health care provider about: Any allergies you have. All medicines you are taking, including vitamins, herbs, eye drops, creams, and over-the-counter medicines. Any surgeries you have had. Any medical conditions you have, including any sexually transmitted infections (STIs) you may have. Whether you are pregnant or may be pregnant. What are the risks? Generally, this is a safe procedure. However, problems may occur, including: Infection. Bleeding. Allergic reactions to medicines. Puncture (perforation) of the uterus or damage to other structures or organs. Accidental placement of the IUD either in the muscle layer of the uterus (myometrium) or outside the uterus. The IUD falling out of the uterus (expulsion). This is more common among women who have recently had a child. Higher risk of an egg being fertilized outside your uterus (ectopic pregnancy).This is rare. Pelvic inflammatory disease (PID), which is an infection in the uterus and fallopian tubes. The IUD does not cause the  infection. The infection is usually from an unknown sexually transmitted infection (STI). This is rare, and it usually happens during the first 20 days after the IUD is inserted. What happens before the procedure? Ask your health care provider about: Changing or stopping your regular medicines. This is especially important if you are taking diabetes medicines or blood thinners. Taking over-the-counter medicines, vitamins, herbs, and supplements. Talk with your health care provider about when to schedule your IUD placement. Your health care provider may recommend taking over-the-counter pain medicines before the procedure. These medicines include ibuprofen and naproxen. You may have tests for: Pregnancy. A pregnancy test involves having a urine or blood sample taken. Sexually transmitted infections (STIs). Placing an IUD in someone who has an STI can make the infection worse. Cervical cancer. You may have a Pap test to check for this type of cancer. This means collecting cells from your cervix to be checked under a microscope. You may have a physical exam to determine the size and position of your uterus. What happens during the procedure? A tool (speculum) will be placed in your vagina and widened so that your health care provider can see your cervix. Medicine, or antiseptic, may be applied to your cervix to help lower your risk of infection. You may be given an anesthetic medicine to numb each side of your cervix. This medicine is usually given by an injection into the cervix. A tool called a uterine sound will be inserted into your uterus to check the length of your uterus and the direction that your uterus may be tilted. A slim instrument or tube (IUD inserter) that holds the IUD will be inserted into your vagina,  through your cervical canal, and into your uterus. The IUD will be placed in the uterus, and the IUD inserter will be removed. The strings that are attached to the IUD will be trimmed  so that they lie just below the cervix. The speculum will be removed. The procedure may vary among health care providers and hospitals. What can I expect after procedure? You may have bleeding after the procedure. This is normal. It varies from light bleeding (spotting) for a few days to menstrual-like bleeding. You may have cramping and pain in the abdomen. You may feel dizzy or light-headed. You may have lower back pain. You may have headaches and nausea. Follow these instructions at home: Before resuming sexual activity, check to make sure that you can feel the IUD string or strings. You should be able to feel the end of the string below the opening of your cervix. If your IUD string is in place, you may resume sexual activity. If you had a hormonal IUD inserted more than 7 days after your most recent period started, you will need to use a backup method of birth control for 7 days after IUD insertion. Ask your health care provider whether this applies to you. Continue to check that the IUD is still in place by feeling for the strings after every menstrual period, or once a month. An IUD will not protect you from sexually transmitted infections (STIs). Use methods to prevent the exchange of body fluids between partners (barrier protection) every time you have sex. Barrier protection can be used during oral, vaginal, or anal sex. Commonly used barrier methods include: Female condom. Female condom. Dental dam. Take over-the-counter and prescription medicines only as told by your health care provider. Keep all follow-up visits. This is important. Contact a health care provider if: You feel light-headed or weak. You have any of the following problems with your IUD string or strings: The string bothers or hurts you or your sexual partner. You cannot feel the string. The string has gotten longer. You can feel the IUD in your vagina. You think you may be pregnant, or you miss your menstrual  period. You think you may have a sexually transmitted infection (STI). Get help right away if you: You have flu-like symptoms, such as tiredness (fatigue) and muscle aches. You have a fever and chills. You have bleeding that is heavier or lasts longer than a normal menstrual cycle. You have abnormal or bad-smelling discharge from your vagina. You develop abdominal pain that is new, is getting worse, or is not in the same area of earlier cramping and pain. You have pain during sexual activity. Summary An intrauterine device (IUD) is a small, T-shaped device that has one or two nylon strings hanging down from it. You may have a copper IUD or a hormone IUD. Ask your health care provider what you need to do before the procedure. You may have some tests and you may have to change or stop some medicines. You may have bleeding after the procedure. This is normal. It varies from light spotting for a few days to menstrual-like bleeding. Check to make sure that you can feel the IUD strings before you resume sexual activity. Check the strings after every menstrual period or once a month. An IUD does not protect against STIs. Use other methods to protect yourself against infections. This information is not intended to replace advice given to you by your health care provider. Make sure you discuss any questions you have with  your health care provider. Document Revised: 03/13/2020 Document Reviewed: 03/13/2020 Elsevier Patient Education  2023 [employer]  

## 2022-06-17 ENCOUNTER — Telehealth: Payer: Self-pay

## 2022-06-17 NOTE — Telephone Encounter (Signed)
Patient notified of results.

## 2022-06-17 NOTE — Telephone Encounter (Signed)
-----   Message from Park Liter, MD sent at 06/17/2022 12:40 PM EDT ----- Chem-7 looks good

## 2022-06-18 ENCOUNTER — Encounter: Payer: Self-pay | Admitting: Cardiology

## 2022-06-18 ENCOUNTER — Ambulatory Visit: Payer: No Typology Code available for payment source | Attending: Cardiology | Admitting: Cardiology

## 2022-06-18 ENCOUNTER — Telehealth: Payer: Self-pay

## 2022-06-18 VITALS — BP 110/82 | HR 61 | Ht 67.0 in | Wt 165.0 lb

## 2022-06-18 DIAGNOSIS — I2510 Atherosclerotic heart disease of native coronary artery without angina pectoris: Secondary | ICD-10-CM | POA: Diagnosis not present

## 2022-06-18 DIAGNOSIS — R0789 Other chest pain: Secondary | ICD-10-CM | POA: Diagnosis not present

## 2022-06-18 DIAGNOSIS — R7303 Prediabetes: Secondary | ICD-10-CM | POA: Diagnosis not present

## 2022-06-18 DIAGNOSIS — E7800 Pure hypercholesterolemia, unspecified: Secondary | ICD-10-CM

## 2022-06-18 DIAGNOSIS — K7689 Other specified diseases of liver: Secondary | ICD-10-CM

## 2022-06-18 NOTE — Progress Notes (Signed)
Cardiology Office Note:    Date:  06/18/2022   ID:  [name], DOB Aug 03, 1966, MRN [id number]  PCP:  Darreld Mclean, MD  Cardiologist:  Jenne Campus, MD    Referring MD: Darreld Mclean, MD   Chief Complaint  Patient presents with   Follow-up  Doing fine  History of Present Illness:    [name] is a 56 y.o. female with past medical history significant for coronary artery disease, she did have coronary CT angio in 2023 which showed 25 to 49% stenosis of LAD, also dyslipidemia, borderline diabetes, atypical chest pain.  She is in my office today for follow-up overall she is doing very well.  She denies have any chest pain tightness squeezing pressure burning chest, she exercised on the regular basis with no difficulties.  Past Medical History:  Diagnosis Date   Abnormal Pap smear of cervix 1996   Cryo   Dysplasia of cervix 1996   cryo, no abn paps since   Hypercholesteremia 2018   Patient denies   Migraine    w/o aura   Placenta accreta    Noted with last cesarean section 2007    Past Surgical History:  Procedure Laterality Date   CESAREAN SECTION  03, 05, 07   CRYOTHERAPY N/A 1996   cervix   DILATION AND CURETTAGE OF UTERUS     times 2    Current Medications: Current Meds  Medication Sig   aspirin EC 81 MG tablet Take 1 tablet (81 mg total) by mouth daily. Swallow whole.   Cholecalciferol (VITAMIN D3) 25 MCG (1000 UT) CAPS Take 1 capsule by mouth daily.   EMGALITY 120 MG/ML SOAJ Inject 120 mg into the skin every 30 (thirty) days.   levonorgestrel (MIRENA) 20 MCG/24HR IUD 1 each by Intrauterine route once. Inserted 05/14/14   naproxen (NAPROSYN) 500 MG tablet Take 500 mg by mouth 2 (two) times daily with a meal.   rosuvastatin (CRESTOR) 20 MG tablet Take 1 tablet (20 mg total) by mouth daily.   SUMAtriptan (IMITREX) 100 MG tablet Take 100 mg by mouth every 2 (two) hours as needed for migraine or headache.   [DISCONTINUED] misoprostol (CYTOTEC) 200  MCG tablet Place one tablet (200 mcg) per vagina at hs the night before IUD removal and then place one tablet (200 mcg) per vagina the am of the IUD removal. (Patient taking differently: Take 200 mcg by mouth 4 (four) times daily. Place one tablet (200 mcg) per vagina at hs the night before IUD removal and then place one tablet (200 mcg) per vagina the am of the IUD removal.)     Allergies:   Patient has no known allergies.   Social History   Socioeconomic History   Marital status: Married    Spouse name: Not on file   Number of children: Not on file   Years of education: Not on file   Highest education level: Not on file  Occupational History   Not on file  Tobacco Use   Smoking status: Never    Passive exposure: Never   Smokeless tobacco: Never  Vaping Use   Vaping Use: Never used  Substance and Sexual Activity   Alcohol use: No   Drug use: No   Sexual activity: Yes    Partners: Male    Birth control/protection: Surgical, I.U.D.    Comment: vasectomy/ Mirena Inserted 05/14/14  Other Topics Concern   Not on file  Social History Narrative   Not on  file   Social Determinants of Health   Financial Resource Strain: Not on file  Food Insecurity: Not on file  Transportation Needs: Not on file  Physical Activity: Not on file  Stress: Not on file  Social Connections: Not on file     Family History: The patient's family history includes Dementia in her father; Diabetes in her father and maternal grandfather; Heart attack (age of onset: 70) in her father; Heart disease in her maternal grandmother and paternal grandfather; Hypertension in her maternal grandmother and mother; Osteoporosis in her mother. There is no history of Colon cancer, Esophageal cancer, Rectal cancer, or Stomach cancer. ROS:   Please see the history of present illness.    All 14 point review of systems negative except as described per history of present illness  EKGs/Labs/Other Studies Reviewed:       Recent Labs: 09/17/2021: Hemoglobin 13.7; Platelets 202.0 06/11/2022: BUN 11; Creatinine, Ser 0.79; Potassium 5.1; Sodium 140 06/15/2022: ALT 30  Recent Lipid Panel    Component Value Date/Time   CHOL 131 05/13/2022 1123   TRIG 57 05/13/2022 1123   HDL 49 05/13/2022 1123   CHOLHDL 2.7 05/13/2022 1123   CHOLHDL 4 01/15/2021 1153   VLDL 24.2 01/15/2021 1153   LDLCALC 70 05/13/2022 1123    Physical Exam:    VS:  BP 110/82 (BP Location: Left Arm, Patient Position: Sitting)   Pulse 61   Ht '5\' 7"'$  (1.702 m)   Wt 165 lb (74.8 kg)   LMP 06/14/2022   SpO2 95%   BMI 25.84 kg/m     Wt Readings from Last 3 Encounters:  06/18/22 165 lb (74.8 kg)  06/16/22 166 lb (75.3 kg)  06/03/22 169 lb (76.7 kg)     GEN:  Well nourished, well developed in no acute distress HEENT: Normal NECK: No JVD; No carotid bruits LYMPHATICS: No lymphadenopathy CARDIAC: RRR, no murmurs, no rubs, no gallops RESPIRATORY:  Clear to auscultation without rales, wheezing or rhonchi  ABDOMEN: Soft, non-tender, non-distended MUSCULOSKELETAL:  No edema; No deformity  SKIN: Warm and dry LOWER EXTREMITIES: no swelling NEUROLOGIC:  Alert and oriented x 3 PSYCHIATRIC:  Normal affect   ASSESSMENT:    1. Coronary artery disease involving native coronary artery of native heart without angina pectoris   2. Hypercholesteremia   3. Atypical chest pain   4. Prediabetes    PLAN:    In order of problems listed above:  Coronary disease she is on antiplatelet therapy which I will continue, asymptomatic.  She is on statin which I will continue. Dyslipidemia I did review her K PN which show me her LDL 70 HDL 49 good cholesterol profile this is from summer of this year she did have liver function test done which showed minimal elevation upper limits of normal 30 she was 35 that this has been repeated which is normal now we will continue statin at present management.  Her coronary CT angio did show some cyst on the liver,  therefore, it would be some value to do ultrasounds of her liver which I will ask her to have it done.  She also want me to recheck her liver function test in about 6 months which we will do. Atypical chest pain still have some not related to exercise at the same time she is able to walk climb stairs with no difficulties.  It does not look like a cardiac pain.  Degree of narrowing discovered on coronary CT angio is not sufficient to justify  it cardiac pain. Prediabetes, her hemoglobin A1c 5.9% we did talk about need to exercise on the regular basis which she does   Medication Adjustments/Labs and Tests Ordered: Current medicines are reviewed at length with the patient today.  Concerns regarding medicines are outlined above.  No orders of the defined types were placed in this encounter.  Medication changes: No orders of the defined types were placed in this encounter.   Signed, Park Liter, MD, [facility] 06/18/2022 9:39 AM    [place]

## 2022-06-18 NOTE — Telephone Encounter (Signed)
-----   Message from Park Liter, MD sent at 06/17/2022  8:20 PM EDT ----- Liver function tests are normal, continue present management

## 2022-06-18 NOTE — Telephone Encounter (Signed)
Patient notified of results via mychart

## 2022-06-18 NOTE — Patient Instructions (Addendum)
Medication Instructions:  Your physician recommends that you continue on your current medications as directed. Please refer to the Current Medication list given to you today.  *If you need a refill on your cardiac medications before your next appointment, please call your pharmacy*   Lab Work: [place] recommends that you return for lab work in: 6 month You need to have labs done when you are fasting.  You can come Monday through Friday 8:00 am to 11:30am and 1:00 to 4:30. You do not need to make an appointment as the order has already been placed. The labs you are going to have done are AST, ALT    Testing/Procedures:     Follow-Up: At [facility], you and your health needs are our priority.  As part of our continuing mission to provide you with exceptional heart care, we have created designated Provider Care Teams.  These Care Teams include your primary Cardiologist (physician) and Advanced Practice Providers (APPs -  Physician Assistants and Nurse Practitioners) who all work together to provide you with the care you need, when you need it.  We recommend signing up for the patient portal called "MyChart".  Sign up information is provided on this After Visit Summary.  MyChart is used to connect with patients for Virtual Visits (Telemedicine).  Patients are able to view lab/test results, encounter notes, upcoming appointments, etc.  Non-urgent messages can be sent to your provider as well.   To learn more about what you can do with MyChart, go to [email or web address].    Your next appointment:   12 month(s)  The format for your next appointment:   In Person  Provider:   Jenne Campus, MD    Other Instructions NA

## 2022-06-18 NOTE — Addendum Note (Signed)
Addended by: Jacobo Forest D on: 06/18/2022 09:54 AM   Modules accepted: Orders

## 2022-06-25 ENCOUNTER — Ambulatory Visit (HOSPITAL_BASED_OUTPATIENT_CLINIC_OR_DEPARTMENT_OTHER)
Admission: RE | Admit: 2022-06-25 | Discharge: 2022-06-25 | Disposition: A | Discharge status: Home / Self Care | Payer: No Typology Code available for payment source | Source: Ambulatory Visit | Attending: Cardiology | Admitting: Cardiology

## 2022-06-25 DIAGNOSIS — K7689 Other specified diseases of liver: Secondary | ICD-10-CM | POA: Diagnosis present

## 2022-07-03 ENCOUNTER — Telehealth: Payer: Self-pay

## 2022-07-03 NOTE — Telephone Encounter (Signed)
Results reviewed with pt as per Dr. Krasowski's note.  Pt verbalized understanding and had no additional questions. Routed to PCP  

## 2022-07-20 ENCOUNTER — Ambulatory Visit (INDEPENDENT_AMBULATORY_CARE_PROVIDER_SITE_OTHER): Payer: No Typology Code available for payment source | Admitting: Obstetrics and Gynecology

## 2022-07-20 ENCOUNTER — Encounter: Payer: Self-pay | Admitting: Obstetrics and Gynecology

## 2022-07-20 VITALS — BP 126/80 | HR 72 | Ht 66.0 in | Wt 166.0 lb

## 2022-07-20 DIAGNOSIS — Z30431 Encounter for routine checking of intrauterine contraceptive device: Secondary | ICD-10-CM

## 2022-07-20 NOTE — Progress Notes (Signed)
GYNECOLOGY  VISIT   HPI: 56 y.o.   Married  Caucasian  female   [phone] with No LMP recorded. (Menstrual status: IUD).   here for  iud check.  Had bright red bleeding for the first week.  Then brown blood.  Spotting almost daily.   Discomfort the first day.  No other pain.   No painful intercourse.   No increased headaches.   GYNECOLOGIC HISTORY: No LMP recorded. (Menstrual status: IUD). Contraception:  iud Menopausal hormone therapy:  n/a Last mammogram:    05-27-22 category c density birads 1:neg  Last pap smear:      01-31-18 neg HPV HR  Neg        OB History     Gravida  5   Para  3   Term  2   Preterm  1   AB  2   Living  3      SAB  2   IAB      Ectopic      Multiple      Live Births  3              Patient Active Problem List   Diagnosis Date Noted   Coronary artery disease 25 to 49% stenosis of LAD based on coronary CT angio in 2023 12/05/2021   Atypical chest pain 10/14/2021   Placenta accreta 10/08/2021   Migraine 10/08/2021   Prediabetes 09/18/2021   Vitamin D deficiency 01/14/2021   Hx of migraine headaches 03/03/2017   Hypercholesteremia 2018   Dysplasia of cervix 1996   Abnormal Pap smear of cervix 1996    Past Medical History:  Diagnosis Date   Abnormal Pap smear of cervix 1996   Cryo   Dysplasia of cervix 1996   cryo, no abn paps since   Hypercholesteremia 2018   Patient denies   Migraine    w/o aura   Placenta accreta    Noted with last cesarean section 2007    Past Surgical History:  Procedure Laterality Date   CESAREAN SECTION  03, 05, 07   CRYOTHERAPY N/A 1996   cervix   DILATION AND CURETTAGE OF UTERUS     times 2    Current Outpatient Medications  Medication Sig Dispense Refill   aspirin EC 81 MG tablet Take 1 tablet (81 mg total) by mouth daily. Swallow whole. 90 tablet 3   Cholecalciferol (VITAMIN D3) 25 MCG (1000 UT) CAPS Take 1 capsule by mouth daily.     EMGALITY 120 MG/ML SOAJ Inject 120 mg  into the skin every 30 (thirty) days.     levonorgestrel (MIRENA) 20 MCG/24HR IUD 1 each by Intrauterine route once.     naproxen (NAPROSYN) 500 MG tablet Take 500 mg by mouth 2 (two) times daily with a meal.  0   rosuvastatin (CRESTOR) 20 MG tablet Take 1 tablet (20 mg total) by mouth daily. 90 tablet 3   SUMAtriptan (IMITREX) 100 MG tablet Take 100 mg by mouth every 2 (two) hours as needed for migraine or headache.     No current facility-administered medications for this visit.     ALLERGIES: Patient has no known allergies.  Family History  Problem Relation Age of Onset   Hypertension Mother    Osteoporosis Mother    Heart attack Father 4       while in [place], bypass   Diabetes Father    Dementia Father    Hypertension Maternal Grandmother    Heart  disease Maternal Grandmother        bypass   Diabetes Maternal Grandfather        questionable   Heart disease Paternal Grandfather    Colon cancer Neg Hx    Esophageal cancer Neg Hx    Rectal cancer Neg Hx    Stomach cancer Neg Hx     Social History   Socioeconomic History   Marital status: Married    Spouse name: Not on file   Number of children: Not on file   Years of education: Not on file   Highest education level: Not on file  Occupational History   Not on file  Tobacco Use   Smoking status: Never    Passive exposure: Never   Smokeless tobacco: Never  Vaping Use   Vaping Use: Never used  Substance and Sexual Activity   Alcohol use: No   Drug use: No   Sexual activity: Yes    Partners: Male    Birth control/protection: Surgical, I.U.D.    Comment: vasectomy/ Mirena Inserted 05/14/14  Other Topics Concern   Not on file  Social History Narrative   Not on file   Social Determinants of Health   Financial Resource Strain: Not on file  Food Insecurity: Not on file  Transportation Needs: Not on file  Physical Activity: Not on file  Stress: Not on file  Social Connections: Not on file  Intimate  Partner Violence: Not on file    Review of Systems  All other systems reviewed and are negative.   PHYSICAL EXAMINATION:    BP 126/80 (BP Location: Right Arm, Patient Position: Sitting, Cuff Size: Normal)   Pulse 72   Ht '5\' 6"'$  (1.676 m)   Wt 166 lb (75.3 kg)   BMI 26.79 kg/m     General appearance: alert, cooperative and appears stated age   Pelvic: External genitalia:  no lesions              Urethra:  normal appearing urethra with no masses, tenderness or lesions              Bartholins and Skenes: normal                 Vagina: normal appearing vagina with normal color and discharge, no lesions              Cervix: no lesions.  Light brown discharge/blood. IUD string noted.                 Bimanual Exam:  Uterus:  normal size, contour, position, consistency, mobility, non-tender              Adnexa: no mass, fullness, tenderness        Chaperone was present for exam:  Kimalexis, RNA.  ASSESSMENT  Mirena IUD check up.   Migraine HAs.   PLAN  Reassurance regarding IUD bleeding profile.  We discussed Mirena as the potential progesterone side of HRT if needed.  Fu for annual exam and prn.   An After Visit Summary was printed and given to the patient.

## 2022-09-22 NOTE — Progress Notes (Unsigned)
[facility] [place], [place], [place] [address] [phone]  Date:  09/23/2022   Name:  [name]   DOB:  September 26, 1965   MRN:  [id number]  PCP:  Darreld Mclean, MD    Chief Complaint: Rash (On the face x a couple of months, right after getting her shingles injection. )   History of Present Illness:  [name] is a 57 y.o. very pleasant female patient who presents with the following:  Patient seen today with concern of a rash I last saw her in January for concern of elevated blood pressure and chest pressure She ended up getting seen by cardiology, had a coronary CT which showed some CAD-now on antiplatelet therapy with baby aspirin as well as statin  She got her first dose of shingrix at the end of September' She then got a rash right around her nose -started around her left nostril.  She does not have a photo I have earlier stages, but it sounds herpetic in type  She noted "water blisters" on erythematous base.  Eventually the left side cleared up and it moved more around the right nostril  Shingrix- first dose so far  Flu shot- declines today  Pap smear-presume done through her gynecologist-I sent a records request Patient Active Problem List   Diagnosis Date Noted   Coronary artery disease 25 to 49% stenosis of LAD based on coronary CT angio in 2023 12/05/2021   Atypical chest pain 10/14/2021   Placenta accreta 10/08/2021   Migraine 10/08/2021   Prediabetes 09/18/2021   Vitamin D deficiency 01/14/2021   Hx of migraine headaches 03/03/2017   Hypercholesteremia 2018   Dysplasia of cervix 1996   Abnormal Pap smear of cervix 1996    Past Medical History:  Diagnosis Date   Abnormal Pap smear of cervix 1996   Cryo   Dysplasia of cervix 1996   cryo, no abn paps since   Hypercholesteremia 2018   Patient denies   Migraine    w/o aura   Placenta accreta    Noted with last cesarean section 2007     Past Surgical History:  Procedure Laterality Date   CESAREAN SECTION  03, 05, 07   CRYOTHERAPY N/A 1996   cervix   DILATION AND CURETTAGE OF UTERUS     times 2    Social History   Tobacco Use   Smoking status: Never    Passive exposure: Never   Smokeless tobacco: Never  Vaping Use   Vaping Use: Never used  Substance Use Topics   Alcohol use: No   Drug use: No    Family History  Problem Relation Age of Onset   Hypertension Mother    Osteoporosis Mother    Heart attack Father 47       while in [place], bypass   Diabetes Father    Dementia Father    Hypertension Maternal Grandmother    Heart disease Maternal Grandmother        bypass   Diabetes Maternal Grandfather        questionable   Heart disease Paternal Grandfather    Colon cancer Neg Hx    Esophageal cancer Neg Hx    Rectal cancer Neg Hx    Stomach cancer Neg Hx     No Known Allergies  Medication list has been reviewed and updated.  Current Outpatient Medications on File Prior to Visit  Medication Sig Dispense Refill   aspirin EC 81 MG tablet Take 1 tablet (81 mg total) by mouth daily. Swallow whole. 90 tablet 3   Cholecalciferol (VITAMIN D3) 25 MCG (1000 UT) CAPS Take 1 capsule by mouth daily.     EMGALITY 120 MG/ML SOAJ Inject 120 mg into the skin every 30 (thirty) days.     levonorgestrel (MIRENA) 20 MCG/24HR IUD 1 each by Intrauterine route once.     naproxen (NAPROSYN) 500 MG tablet Take 500 mg by mouth 2 (two) times daily with a meal.  0   rosuvastatin (CRESTOR) 20 MG tablet Take 1 tablet (20 mg total) by mouth daily. 90 tablet 3   SUMAtriptan (IMITREX) 100 MG tablet Take 100 mg by mouth every 2 (two) hours as needed for migraine or headache.     No current facility-administered medications on file prior to visit.    Review of Systems:  As per HPI- otherwise negative.   Physical Examination: Vitals:   09/23/22 1029  BP: 110/60  Pulse: 60  Resp: 18  Temp: 97.8 F (36.6 C)   SpO2: 98%   Vitals:   09/23/22 1029  Weight: 162 lb (73.5 kg)  Height: '5\' 7"'$  (1.702 m)   Body mass index is 25.37 kg/m. Ideal Body Weight: Weight in (lb) to have BMI = 25: 159.3  GEN: no acute distress.  Normal weight, looks well HEENT: Atraumatic, Normocephalic.  Ears and Nose: No external deformity. CV: RRR, No M/G/R. No JVD. No thrill. No extra heart sounds. PULM: CTA B, no wheezes, crackles, rhonchi. No retractions. No resp. distress. No accessory muscle use. ABD: S, NT, ND, +BS. No rebound. No HSM. EXTR: No c/c/e Her rash is improved at this time, but very still an erythematous patch around the right nostril.  Some hyperpigmentation is left from previous rash or in the left nostril Oropharynx is normal  Assessment and Plan: Rash - Plan: valACYclovir (VALTREX) 1000 MG tablet, DISCONTINUED: valACYclovir (VALTREX) 1000 MG tablet Patient seen today with rash around her nose, possibly herpetic in origin and related to shingles vaccine.  Will have her take Valtrex thousand 3 times daily for 10 days.  I have asked her to let me know how she does with this treatment.  If she does seem to respond suggesting herpetic origin will report potential side effect of the vaccine  Signed Lamar Blinks, MD

## 2022-09-22 NOTE — Patient Instructions (Incomplete)
Good to see you again today! I recommend getting your flu and the latest COVID booster at your pharmacy if not done already  I am not sure what caused the rash on your face- however, it does sounds like it might be a herpes type virus, potentially triggered by the shot We will try 10 days of valtrex for this- please let me know how you respond!

## 2022-09-23 ENCOUNTER — Ambulatory Visit (INDEPENDENT_AMBULATORY_CARE_PROVIDER_SITE_OTHER): Payer: No Typology Code available for payment source | Admitting: Family Medicine

## 2022-09-23 VITALS — BP 110/60 | HR 60 | Temp 97.8°F | Resp 18 | Ht 67.0 in | Wt 162.0 lb

## 2022-09-23 DIAGNOSIS — R21 Rash and other nonspecific skin eruption: Secondary | ICD-10-CM | POA: Diagnosis not present

## 2022-09-23 MED ORDER — VALACYCLOVIR HCL 1 G PO TABS: 1000.0000 mg | ORAL_TABLET | Freq: Three times a day (TID) | ORAL | 0 refills | Status: DC

## 2022-10-05 ENCOUNTER — Encounter: Payer: Self-pay | Admitting: Family Medicine

## 2022-10-05 ENCOUNTER — Other Ambulatory Visit: Payer: Self-pay | Admitting: Family Medicine

## 2022-10-05 MED ORDER — METRONIDAZOLE 1 % EX CREA: TOPICAL_CREAM | Freq: Every day | CUTANEOUS | 0 refills | Status: DC

## 2022-10-05 MED ORDER — CEPHALEXIN 500 MG PO CAPS: 500.0000 mg | ORAL_CAPSULE | Freq: Three times a day (TID) | ORAL | 0 refills | Status: DC

## 2022-10-06 MED ORDER — METRONIDAZOLE 0.75 % EX CREA: TOPICAL_CREAM | Freq: Two times a day (BID) | CUTANEOUS | 0 refills | Status: DC

## 2022-10-06 MED ORDER — FLUCONAZOLE 150 MG PO TABS: 150.0000 mg | ORAL_TABLET | Freq: Once | ORAL | 0 refills | Status: AC

## 2022-10-06 NOTE — Telephone Encounter (Signed)
Patient's husband called stating the 1% cream is not being covered by insurance but they will cover the 0.75% cream. He would like for the cream to be sent to their new pharmacy:  [facility] [address] [place], [place] [address]  Please advise.

## 2022-10-06 NOTE — Addendum Note (Signed)
Addended by: Lamar Blinks C on: 10/06/2022 01:19 PM   Modules accepted: Orders

## 2023-02-02 LAB — ALT: ALT: 21 IU/L (ref 0–32)

## 2023-02-02 LAB — AST: AST: 19 IU/L (ref 0–40)

## 2023-02-03 ENCOUNTER — Telehealth: Payer: Self-pay

## 2023-02-03 NOTE — Telephone Encounter (Signed)
Left message on My Chart with normal results per Dr. Krasowski's note 

## 2023-02-05 ENCOUNTER — Telehealth: Payer: Self-pay

## 2023-02-05 NOTE — Telephone Encounter (Signed)
Pt viewed results in My Chart per Dr. Krasowski's note. Routed to PCP.  

## 2023-02-22 ENCOUNTER — Telehealth: Payer: Self-pay | Admitting: Cardiology

## 2023-02-22 MED ORDER — ROSUVASTATIN CALCIUM 20 MG PO TABS: 20.0000 mg | ORAL_TABLET | Freq: Every day | ORAL | 0 refills | Status: DC

## 2023-02-22 NOTE — Telephone Encounter (Signed)
Refills of Rosuvastatin 20 mg sent to [facility].

## 2023-02-22 NOTE — Telephone Encounter (Signed)
*  STAT* If patient is at the pharmacy, call can be transferred to refill team.   1. Which medications need to be refilled? (please list name of each medication and dose if known) rosuvastatin (CRESTOR) 20 MG tablet   2. Which pharmacy/location (including street and city if local pharmacy) is medication to be sent to? [address] [facility] [place], [place] - [address]   3. Do they need a 30 day or 90 day supply? 90

## 2023-02-25 ENCOUNTER — Encounter: Payer: Self-pay | Admitting: Family Medicine

## 2023-02-25 ENCOUNTER — Ambulatory Visit: Payer: No Typology Code available for payment source | Admitting: Family Medicine

## 2023-02-25 VITALS — BP 112/82 | HR 70 | Temp 98.0°F | Resp 18 | Ht 67.0 in | Wt 165.4 lb

## 2023-02-25 DIAGNOSIS — Z13220 Encounter for screening for lipoid disorders: Secondary | ICD-10-CM | POA: Diagnosis not present

## 2023-02-25 DIAGNOSIS — Z130 Encounter for screening for diseases of the blood and blood-forming organs and certain disorders involving the immune mechanism: Secondary | ICD-10-CM | POA: Diagnosis not present

## 2023-02-25 DIAGNOSIS — Z131 Encounter for screening for diabetes mellitus: Secondary | ICD-10-CM | POA: Diagnosis not present

## 2023-02-25 DIAGNOSIS — L639 Alopecia areata, unspecified: Secondary | ICD-10-CM | POA: Diagnosis not present

## 2023-02-25 DIAGNOSIS — Z1329 Encounter for screening for other suspected endocrine disorder: Secondary | ICD-10-CM | POA: Diagnosis not present

## 2023-02-25 DIAGNOSIS — E041 Nontoxic single thyroid nodule: Secondary | ICD-10-CM

## 2023-02-25 DIAGNOSIS — L659 Nonscarring hair loss, unspecified: Secondary | ICD-10-CM

## 2023-02-25 DIAGNOSIS — R5383 Other fatigue: Secondary | ICD-10-CM

## 2023-02-25 LAB — COMPREHENSIVE METABOLIC PANEL
ALT: 23 U/L (ref 0–35)
AST: 20 U/L (ref 0–37)
Albumin: 4.6 g/dL (ref 3.5–5.2)
Alkaline Phosphatase: 46 U/L (ref 39–117)
BUN: 12 mg/dL (ref 6–23)
CO2: 28 mEq/L (ref 19–32)
Calcium: 9.4 mg/dL (ref 8.4–10.5)
Chloride: 102 mEq/L (ref 96–112)
Creatinine, Ser: 0.61 mg/dL (ref 0.40–1.20)
GFR: 100.8 mL/min (ref >60.00)
Glucose, Bld: 92 mg/dL (ref 70–99)
Potassium: 4 mEq/L (ref 3.5–5.1)
Sodium: 138 mEq/L (ref 135–145)
Total Bilirubin: 0.9 mg/dL (ref 0.2–1.2)
Total Protein: 7 g/dL (ref 6.0–8.3)

## 2023-02-25 LAB — LIPID PANEL
Cholesterol: 136 mg/dL (ref 0–200)
HDL: 54.1 mg/dL (ref >39.00)
LDL Cholesterol: 71 mg/dL (ref 0–99)
NonHDL: 82.08
Total CHOL/HDL Ratio: 3
Triglycerides: 54 mg/dL (ref 0.0–149.0)
VLDL: 10.8 mg/dL (ref 0.0–40.0)

## 2023-02-25 LAB — TSH: TSH: 1.02 u[IU]/mL (ref 0.35–5.50)

## 2023-02-25 LAB — CBC
HCT: 43.9 % (ref 36.0–46.0)
Hemoglobin: 14.3 g/dL (ref 12.0–15.0)
MCHC: 32.6 g/dL (ref 30.0–36.0)
MCV: 96.7 fl (ref 78.0–100.0)
Platelets: 171 10*3/uL (ref 150.0–400.0)
RBC: 4.53 Mil/uL (ref 3.87–5.11)
RDW: 12.9 % (ref 11.5–15.5)
WBC: 7.3 10*3/uL (ref 4.0–10.5)

## 2023-02-25 LAB — HEMOGLOBIN A1C: Hgb A1c MFr Bld: 5.8 % (ref 4.6–6.5)

## 2023-02-25 LAB — FERRITIN: Ferritin: 24.6 ng/mL (ref 10.0–291.0)

## 2023-02-25 LAB — VITAMIN D 25 HYDROXY (VIT D DEFICIENCY, FRACTURES): VITD: 40.65 ng/mL (ref 30.00–100.00)

## 2023-02-25 NOTE — Patient Instructions (Signed)
It was good to see you today, I will be in touch with your labs soon as possible.  After labs, please stop by imaging on the ground floor and they can set up your thyroid ultrasound  I think you have a condition called alopecia areata, which is a common autoimmune disorder where body attacks of hair follicles on part of the body.  This can vary a lot in severity-how widespread the hair loss is Thankfully I do not think you will have a severe case  I would suggest calling your dermatologist and set up an appointment, they can give you further advice about any treatment that may be helpful

## 2023-02-25 NOTE — Progress Notes (Addendum)
[facility] [address] [place], [place] [address] [phone]  Date:  02/25/2023   Name:  [name]   DOB:  Jan 31, 1966   MRN:  [id number]  PCP:  Pearline Cables, MD    Chief Complaint: Alopecia (Pt says she went to a hair appointment yesterday 02/24/23 and her beautician noticed hair missing that was not at her last appointment 6 weeks ago. )   History of Present Illness:  [name] is a 57 y.o. very pleasant female patient who presents with the following:  Pt was at the hairdresser yesterday and was noted to have a discrete patch of baldness on the back of her head on the posterior right.  This is apparently new, she gets her haircut and color regularly and this has not been noted in the past.  The patient was not aware of it herself until her hairdresser pointed out  This is not happened previously, she is otherwise feeling well  She does have a dermatologist she can see  We can update routine labs today  Patient Active Problem List   Diagnosis Date Noted   Coronary artery disease 25 to 49% stenosis of LAD based on coronary CT angio in 2023 12/05/2021   Atypical chest pain 10/14/2021   Placenta accreta 10/08/2021   Migraine 10/08/2021   Prediabetes 09/18/2021   Vitamin D deficiency 01/14/2021   Hx of migraine headaches 03/03/2017   Hypercholesteremia 2018   Dysplasia of cervix 1996   Abnormal Pap smear of cervix 1996    Past Medical History:  Diagnosis Date   Abnormal Pap smear of cervix 1996   Cryo   Dysplasia of cervix 1996   cryo, no abn paps since   Hypercholesteremia 2018   Patient denies   Migraine    w/o aura   Placenta accreta    Noted with last cesarean section 2007    Past Surgical History:  Procedure Laterality Date   CESAREAN SECTION  03, 05, 07   CRYOTHERAPY N/A 1996   cervix   DILATION AND CURETTAGE OF UTERUS     times 2    Social History   Tobacco Use    Smoking status: Never    Passive exposure: Never   Smokeless tobacco: Never  Vaping Use   Vaping Use: Never used  Substance Use Topics   Alcohol use: No   Drug use: No    Family History  Problem Relation Age of Onset   Hypertension Mother    Osteoporosis Mother    Heart attack Father 6       while in [place], bypass   Diabetes Father    Dementia Father    Hypertension Maternal Grandmother    Heart disease Maternal Grandmother        bypass   Diabetes Maternal Grandfather        questionable   Heart disease Paternal Grandfather    Colon cancer Neg Hx    Esophageal cancer Neg Hx    Rectal cancer Neg Hx    Stomach cancer Neg Hx     No Known Allergies  Medication list has been reviewed and updated.  Current Outpatient Medications on File Prior to Visit  Medication Sig Dispense Refill   aspirin EC 81 MG tablet Take 1 tablet (81 mg total) by mouth daily. Swallow whole. 90 tablet 3   Cholecalciferol (VITAMIN D3) 25 MCG (1000 UT) CAPS Take  1 capsule by mouth daily.     EMGALITY 120 MG/ML SOAJ Inject 120 mg into the skin every 30 (thirty) days.     levonorgestrel (MIRENA) 20 MCG/24HR IUD 1 each by Intrauterine route once.     naproxen (NAPROSYN) 500 MG tablet Take 500 mg by mouth 2 (two) times daily with a meal.  0   rosuvastatin (CRESTOR) 20 MG tablet Take 1 tablet (20 mg total) by mouth daily. 90 tablet 0   SUMAtriptan (IMITREX) 100 MG tablet Take 100 mg by mouth every 2 (two) hours as needed for migraine or headache.     No current facility-administered medications on file prior to visit.    Review of Systems:  As per HPI- otherwise negative.   Physical Examination: Vitals:   02/25/23 1021  BP: 112/82  Pulse: 70  Resp: 18  Temp: 98 F (36.7 C)  SpO2: 98%   Vitals:   02/25/23 1021  Weight: 165 lb 6.4 oz (75 kg)  Height: 5\' 7"  (1.702 m)   Body mass index is 25.91 kg/m. Ideal Body Weight: Weight in (lb) to have BMI = 25: 159.3  GEN: no acute  distress.  Looks well, normal weight HEENT: Atraumatic, Normocephalic.  Ears and Nose: No external deformity. CV: RRR, No M/G/R. No JVD. No thrill. No extra heart sounds. PULM: CTA B, no wheezes, crackles, rhonchi. No retractions. No resp. distress. No accessory muscle use. EXTR: No c/c/e PSYCH: Normally interactive. Conversant.  Approximately 4 cm diameter area of near total hearing loss on the right posterior scalp, edges are not well-defined. No other changes of skin noted Otherwise hair growth appears normal Patient also points out fullness on the left side of her thyroid compared with the right side-she does indeed have fullness on the left thyroid, possible palpable nodule     Assessment and Plan: Alopecia areata  Screening for diabetes mellitus - Plan: Comprehensive metabolic panel, Hemoglobin A1c  Screening, lipid - Plan: Lipid panel  Thyroid disorder screening - Plan: TSH  Screening for deficiency anemia - Plan: CBC  Fatigue, unspecified type - Plan: VITAMIN D 25 Hydroxy (Vit-D Deficiency, Fractures)  Hair loss - Plan: TSH, Ferritin  Thyroid nodule - Plan: US THYROID  Patient seen today with likely alopecia areata as pictured above.  She does have a dermatologist, recommended that she go ahead and schedule an appointment.  However, because her symptoms and area affected are mild we hope that she can simply observe this finding  Will obtain routine blood work as above Will obtain thyroid ultrasound to evaluate for any concerning nodules  Signed Abbe Amsterdam, MD  Received labs, message to patient  Results for orders placed or performed in visit on 02/25/23  CBC  Result Value Ref Range   WBC 7.3 4.0 - 10.5 K/uL   RBC 4.53 3.87 - 5.11 Mil/uL   Platelets 171.0 150.0 - 400.0 K/uL   Hemoglobin 14.3 12.0 - 15.0 g/dL   HCT 82.9 56.2 - 13.0 %   MCV 96.7 78.0 - 100.0 fl   MCHC 32.6 30.0 - 36.0 g/dL   RDW 86.5 78.4 - 69.6 %  Comprehensive metabolic panel  Result  Value Ref Range   Sodium 138 135 - 145 mEq/L   Potassium 4.0 3.5 - 5.1 mEq/L   Chloride 102 96 - 112 mEq/L   CO2 28 19 - 32 mEq/L   Glucose, Bld 92 70 - 99 mg/dL   BUN 12 6 - 23 mg/dL   Creatinine, Ser 2.95  0.40 - 1.20 mg/dL   Total Bilirubin 0.9 0.2 - 1.2 mg/dL   Alkaline Phosphatase 46 39 - 117 U/L   AST 20 0 - 37 U/L   ALT 23 0 - 35 U/L   Total Protein 7.0 6.0 - 8.3 g/dL   Albumin 4.6 3.5 - 5.2 g/dL   GFR 098.11 >91.47 mL/min   Calcium 9.4 8.4 - 10.5 mg/dL  Hemoglobin W2N  Result Value Ref Range   Hgb A1c MFr Bld 5.8 4.6 - 6.5 %  Lipid panel  Result Value Ref Range   Cholesterol 136 0 - 200 mg/dL   Triglycerides 56.2 0.0 - 149.0 mg/dL   HDL 13.08 >65.78 mg/dL   VLDL 46.9 0.0 - 62.9 mg/dL   LDL Cholesterol 71 0 - 99 mg/dL   Total CHOL/HDL Ratio 3    NonHDL 82.08   TSH  Result Value Ref Range   TSH 1.02 0.35 - 5.50 uIU/mL  VITAMIN D 25 Hydroxy (Vit-D Deficiency, Fractures)  Result Value Ref Range   VITD 40.65 30.00 - 100.00 ng/mL  Ferritin  Result Value Ref Range   Ferritin 24.6 10.0 - 291.0 ng/mL

## 2023-02-26 ENCOUNTER — Ambulatory Visit (HOSPITAL_BASED_OUTPATIENT_CLINIC_OR_DEPARTMENT_OTHER)
Admission: RE | Admit: 2023-02-26 | Discharge: 2023-02-26 | Disposition: A | Discharge status: Home / Self Care | Payer: No Typology Code available for payment source | Source: Ambulatory Visit | Attending: Family Medicine | Admitting: Family Medicine

## 2023-02-26 DIAGNOSIS — E041 Nontoxic single thyroid nodule: Secondary | ICD-10-CM | POA: Diagnosis present

## 2023-02-28 ENCOUNTER — Encounter: Payer: Self-pay | Admitting: Family Medicine

## 2023-02-28 DIAGNOSIS — E049 Nontoxic goiter, unspecified: Secondary | ICD-10-CM

## 2023-03-01 ENCOUNTER — Telehealth: Payer: Self-pay | Admitting: Family Medicine

## 2023-03-01 ENCOUNTER — Encounter (HOSPITAL_BASED_OUTPATIENT_CLINIC_OR_DEPARTMENT_OTHER): Payer: Self-pay

## 2023-03-01 ENCOUNTER — Ambulatory Visit (HOSPITAL_BASED_OUTPATIENT_CLINIC_OR_DEPARTMENT_OTHER)
Admission: RE | Admit: 2023-03-01 | Discharge: 2023-03-01 | Disposition: A | Discharge status: Home / Self Care | Payer: No Typology Code available for payment source | Source: Ambulatory Visit | Attending: Family Medicine | Admitting: Family Medicine

## 2023-03-01 DIAGNOSIS — E049 Nontoxic goiter, unspecified: Secondary | ICD-10-CM | POA: Diagnosis present

## 2023-03-01 DIAGNOSIS — Q892 Congenital malformations of other endocrine glands: Secondary | ICD-10-CM

## 2023-03-01 MED ORDER — IOHEXOL 300 MG/ML  SOLN
100.0000 mL | Freq: Once | INTRAMUSCULAR | Status: AC | PRN
  Administered 2023-03-01: 75 mL via INTRAVENOUS

## 2023-03-01 NOTE — Telephone Encounter (Signed)
Called pt to go over her recent CT report  CT Soft Tissue Neck W Contrast  Result Date: 03/01/2023 CLINICAL DATA:  57 year old female with left neck palpable abnormality, complex cystic mass on thyroid ultrasound. Subsequent encounter. EXAM: CT NECK WITH CONTRAST TECHNIQUE: Multidetector CT imaging of the neck was performed using the standard protocol following the bolus administration of intravenous contrast. RADIATION DOSE REDUCTION: This exam was performed according to the departmental dose-optimization program which includes automated exposure control, adjustment of the mA and/or kV according to patient size and/or use of iterative reconstruction technique. CONTRAST:  75mL OMNIPAQUE IOHEXOL 300 MG/ML  SOLN COMPARISON:  Thyroid ultrasound 02/26/2023. FINDINGS: Pharynx and larynx: Larynx and pharynx soft tissue contours are within normal limits. Negative parapharyngeal and retropharyngeal spaces. Salivary glands: Negative.  Negative sublingual space. Thyroid: Negative CT appearance of orthotopic thyroid. Situated between the thyroid cartilage and hyoid, abutting both, and to the left of midline is a somewhat S-shaped, oval cystic mass intimately associated with the left strap muscle, and protruding into the pre epiglottic fat. This has mildly complex fluid density and encompasses 18 x 17 x 28 mm (AP by transverse by CC) corresponding to the recent ultrasound finding. No regional inflammation. Lymph nodes: Negative. Bilateral cervical lymph nodes remain within normal limits. Vascular: Major vascular structures in the neck and at the skull base are patent. No significant atherosclerosis in the neck. Limited intracranial: Negative. Visualized orbits: Negative. Mastoids and visualized paranasal sinuses: Clear. Skeleton: Mildly scalloped anterior midline segment of the hyoid bone, in association with the cystic mass described earlier. But no acute or suspicious bony findings. Mild for age cervical spine  degeneration. Upper chest: Mildly atelectatic changes in the visible upper lungs, also the trachea and visible carina. Negative visible superior mediastinum. IMPRESSION: 1. Thyroglossal duct cyst corresponding to the recent ultrasound finding: Up to 2.8 cm, situated to the left of midline and intimately associated with the hyoid and thyroid cartilage. No complicating features. Negative CT appearance of orthotopic thyroid. 2. Otherwise negative CT appearance of the Neck. Electronically Signed   By: Odessa Fleming M.D.   On: 03/01/2023 13:02   Appears to have a throglossal duct cyst only- no sign of cancer

## 2023-03-08 ENCOUNTER — Ambulatory Visit: Payer: No Typology Code available for payment source | Admitting: Family Medicine

## 2023-04-22 ENCOUNTER — Encounter: Payer: Self-pay | Admitting: Family Medicine

## 2023-05-20 ENCOUNTER — Other Ambulatory Visit: Payer: Self-pay | Admitting: Cardiology

## 2023-05-26 NOTE — Progress Notes (Signed)
57 y.o. [id number] Married Caucasian female here for annual exam.    New dx of thyroglossal duct cyst.  Feels pressure in her throat.  Seeing ENT.   Has Mirena IUD placed in 2023.  No spotting since April 25, 2022.  Some hot flashes.  FSH 3.3 on 06/03/22. Wants to check her hormones today.  She feels a lump at times in her right breast.  Migraines are much better.  Emgality helps.   Had a vesicular reaction after first Shingrix.   PCP:   Dr. Patsy Lager  No LMP recorded. (Menstrual status: IUD).           Sexually active: Yes.    The current method of family planning is IUD/vasectomy.   Mirena placed 07/06/22. Exercising: Yes.     Walking, free weights Smoker:  no  Health Maintenance: Pap:  01/31/18 neg: HR HPV neg History of abnormal Pap:  yes, many years ago MMG:  05/27/22 Breast Density Cat C, BI-RADS CAT 1 neg Colonoscopy:  07/14/18 - due in 2029 BMD:   n/a  Result  n/a TDaP:  10/18/13 Gardasil:   no HIV: n/a Hep C: 01/15/21 NR Screening Labs:  PCP   reports that she has never smoked. She has never been exposed to tobacco smoke. She has never used smokeless tobacco. She reports that she does not drink alcohol and does not use drugs.  Past Medical History:  Diagnosis Date   Abnormal Pap smear of cervix 1996   Cryo   Dysplasia of cervix 1996   cryo, no abn paps since   Hypercholesteremia 2018   Patient denies   Migraine    w/o aura   Placenta accreta    Noted with last cesarean section 2007    Past Surgical History:  Procedure Laterality Date   CESAREAN SECTION  03, 05, 07   CRYOTHERAPY N/A 1996   cervix   DILATION AND CURETTAGE OF UTERUS     times 2    Current Outpatient Medications  Medication Sig Dispense Refill   aspirin EC 81 MG tablet Take 1 tablet (81 mg total) by mouth daily. Swallow whole. 90 tablet 3   Cholecalciferol (VITAMIN D3) 25 MCG (1000 UT) CAPS Take 1 capsule by mouth daily.     EMGALITY 120 MG/ML SOAJ Inject 120 mg into the skin every 30  (thirty) days.     levonorgestrel (MIRENA) 20 MCG/24HR IUD 1 each by Intrauterine route once.     naproxen (NAPROSYN) 500 MG tablet Take 500 mg by mouth 2 (two) times daily with a meal.  0   rosuvastatin (CRESTOR) 20 MG tablet TAKE 1 TABLET (20 MG TOTAL) BY MOUTH DAILY. 30 tablet 0   SUMAtriptan (IMITREX) 100 MG tablet Take 100 mg by mouth every 2 (two) hours as needed for migraine or headache.     No current facility-administered medications for this visit.    Family History  Problem Relation Age of Onset   Hypertension Mother    Osteoporosis Mother    Heart attack Father 81       while in [place], bypass   Diabetes Father    Dementia Father    Hypertension Maternal Grandmother    Heart disease Maternal Grandmother        bypass   Diabetes Maternal Grandfather        questionable   Heart disease Paternal Grandfather    Colon cancer Neg Hx    Esophageal cancer Neg Hx    Rectal cancer  Neg Hx    Stomach cancer Neg Hx     Review of Systems  All other systems reviewed and are negative.   Exam:   BP 124/78 (BP Location: Left Arm, Patient Position: Sitting, Cuff Size: Normal)   Pulse 64   Ht 5\' 7"  (1.702 m)   SpO2 98%   BMI 25.91 kg/m     General appearance: alert, cooperative and appears stated age Head: normocephalic, without obvious abnormality, atraumatic Neck: no adenopathy, supple, symmetrical, trachea midline and thyroid normal to inspection and palpation Lungs: clear to auscultation bilaterally Breasts: normal appearance, no masses or tenderness, No nipple retraction or dimpling, No nipple discharge or bleeding, No axillary adenopathy Heart: regular rate and rhythm Abdomen: soft, non-tender; no masses, no organomegaly Extremities: extremities normal, atraumatic, no cyanosis or edema Skin: skin color, texture, turgor normal. No rashes or lesions Lymph nodes: cervical, supraclavicular, and axillary nodes normal. Neurologic: grossly normal  Pelvic: External  genitalia:  no lesions              No abnormal inguinal nodes palpated.              Urethra:  normal appearing urethra with no masses, tenderness or lesions              Bartholins and Skenes: normal                 Vagina: normal appearing vagina with normal color and discharge, no lesions              Cervix: no lesions.  IUD strings noted.               Pap taken: yes Bimanual Exam:  Uterus:  normal size, contour, position, consistency, mobility, non-tender              Adnexa: no mass, fullness, tenderness              Rectal exam: yes.  Confirms.              Anus:  normal sphincter tone, no lesions  Chaperone was present for exam:  Warren Lacy, CMA  Assessment:   Well woman visit with gynecologic exam. Mirena IUD.  Cervical cancer screening. Right breast lump noted by patient.  I am not able to feel it.  Hx right breast lymph node noted on dx breast imaging in the past.  Plan: Dx bilateral mammogram and right breast US.  Self breast awareness reviewed. Pap and HR HPV collected. Will check FSH and estradiol.  Routine labs with PCP.  Guidelines for Calcium, Vitamin D, regular exercise program including cardiovascular and weight bearing exercise. Follow up annually and prn.

## 2023-06-07 ENCOUNTER — Encounter: Payer: Self-pay | Admitting: Family Medicine

## 2023-06-09 ENCOUNTER — Other Ambulatory Visit (HOSPITAL_COMMUNITY)
Admission: RE | Admit: 2023-06-09 | Discharge: 2023-06-09 | Disposition: A | Discharge status: Home / Self Care | Payer: No Typology Code available for payment source | Source: Ambulatory Visit | Attending: Obstetrics and Gynecology | Admitting: Obstetrics and Gynecology

## 2023-06-09 ENCOUNTER — Telehealth: Payer: Self-pay | Admitting: Obstetrics and Gynecology

## 2023-06-09 ENCOUNTER — Ambulatory Visit (INDEPENDENT_AMBULATORY_CARE_PROVIDER_SITE_OTHER): Payer: No Typology Code available for payment source | Admitting: Obstetrics and Gynecology

## 2023-06-09 ENCOUNTER — Encounter: Payer: Self-pay | Admitting: Obstetrics and Gynecology

## 2023-06-09 VITALS — BP 124/78 | HR 64 | Ht 67.0 in

## 2023-06-09 DIAGNOSIS — Q892 Congenital malformations of other endocrine glands: Secondary | ICD-10-CM | POA: Insufficient documentation

## 2023-06-09 DIAGNOSIS — N6315 Unspecified lump in the right breast, overlapping quadrants: Secondary | ICD-10-CM | POA: Diagnosis not present

## 2023-06-09 DIAGNOSIS — Z124 Encounter for screening for malignant neoplasm of cervix: Secondary | ICD-10-CM | POA: Diagnosis present

## 2023-06-09 DIAGNOSIS — Z01419 Encounter for gynecological examination (general) (routine) without abnormal findings: Secondary | ICD-10-CM | POA: Diagnosis not present

## 2023-06-09 DIAGNOSIS — N951 Menopausal and female climacteric states: Secondary | ICD-10-CM

## 2023-06-09 NOTE — Telephone Encounter (Signed)
Spoke w/ [facility] and scheduled pt for 06/11/2023 @ 310pm.   Pt advised and voiced understanding. Reported that she would not be able to make that appt date/time.  However, will call asap to r/s since they have a 24hr cancellation fee.  Will leave encounter open for f/u on r/s date.

## 2023-06-09 NOTE — Telephone Encounter (Signed)
R/s date for 06/24/2023.  Routing to provider for final review and closing encounter.

## 2023-06-09 NOTE — Telephone Encounter (Signed)
Pt called to follow up on previous message. Please advise patient

## 2023-06-09 NOTE — Patient Instructions (Signed)

## 2023-06-09 NOTE — Telephone Encounter (Signed)
Please schedule [facility] bilateral [facility]   My patient has a lump of the right breast.  I am not able to feel it.   She has a history of lymph node in the right breast in that general area.

## 2023-06-10 LAB — FOLLICLE STIMULATING HORMONE: FSH: 61.9 m[IU]/mL

## 2023-06-10 LAB — ESTRADIOL: Estradiol: 34 pg/mL

## 2023-06-11 ENCOUNTER — Encounter: Payer: Self-pay | Admitting: Obstetrics and Gynecology

## 2023-06-11 ENCOUNTER — Other Ambulatory Visit: Payer: No Typology Code available for payment source

## 2023-06-15 LAB — CYTOLOGY - PAP
Comment: NEGATIVE
Diagnosis: NEGATIVE
Diagnosis: REACTIVE
High risk HPV: NEGATIVE

## 2023-06-24 ENCOUNTER — Ambulatory Visit
Admission: RE | Admit: 2023-06-24 | Discharge: 2023-06-24 | Disposition: A | Discharge status: Home / Self Care | Payer: No Typology Code available for payment source | Source: Ambulatory Visit | Attending: Obstetrics and Gynecology | Admitting: Obstetrics and Gynecology

## 2023-06-24 DIAGNOSIS — N6315 Unspecified lump in the right breast, overlapping quadrants: Secondary | ICD-10-CM

## 2023-07-16 ENCOUNTER — Other Ambulatory Visit: Payer: Self-pay | Admitting: Cardiology

## 2023-07-19 ENCOUNTER — Other Ambulatory Visit: Payer: Self-pay

## 2023-07-19 ENCOUNTER — Telehealth: Payer: Self-pay | Admitting: Cardiology

## 2023-07-19 MED ORDER — ROSUVASTATIN CALCIUM 20 MG PO TABS: 20.0000 mg | ORAL_TABLET | Freq: Every day | ORAL | 3 refills | Status: DC

## 2023-07-19 NOTE — Telephone Encounter (Signed)
Pt has appt 08/31/23 Refilled Crestor #[id number] ref x 3

## 2023-07-19 NOTE — Telephone Encounter (Signed)
Refilled Crestor #[id number] ref x 3. Pt has appt 08/31/23

## 2023-07-19 NOTE — Telephone Encounter (Signed)
Pt c/o medication issue:  1. Name of Medication: rosuvastatin (CRESTOR) 20 MG tablet   2. How are you currently taking this medication (dosage and times per day)? As prescribed  3. Are you having a reaction (difficulty breathing--STAT)? No   4. What is your medication issue? Only 15 tablets were sent to pharmacy stating she needed an appointment, but patient is already scheduled for 12/17 to be seen. She is needing refill resent to last her until her upcoming appointment. Please advise.

## 2023-08-31 ENCOUNTER — Encounter: Payer: Self-pay | Admitting: Cardiology

## 2023-08-31 ENCOUNTER — Ambulatory Visit: Payer: No Typology Code available for payment source | Attending: Cardiology | Admitting: Cardiology

## 2023-08-31 VITALS — BP 122/80 | HR 71 | Ht 67.5 in | Wt 151.0 lb

## 2023-08-31 DIAGNOSIS — I2510 Atherosclerotic heart disease of native coronary artery without angina pectoris: Secondary | ICD-10-CM | POA: Diagnosis not present

## 2023-08-31 DIAGNOSIS — E7800 Pure hypercholesterolemia, unspecified: Secondary | ICD-10-CM | POA: Diagnosis not present

## 2023-08-31 DIAGNOSIS — R7303 Prediabetes: Secondary | ICD-10-CM | POA: Diagnosis not present

## 2023-08-31 DIAGNOSIS — R0789 Other chest pain: Secondary | ICD-10-CM | POA: Diagnosis not present

## 2023-08-31 NOTE — Patient Instructions (Signed)

## 2023-08-31 NOTE — Progress Notes (Signed)
Cardiology Office Note:    Date:  08/31/2023   ID:  [name], DOB Oct 09, 1965, MRN [id number]  PCP:  Pearline Cables, MD  Cardiologist:  Gypsy Balsam, MD    Referring MD: Pearline Cables, MD   Chief Complaint  Patient presents with   Follow-up    History of Present Illness:    [name] is a 57 y.o. female past medical history significant for coronary artery disease she did have coronary CT angio in 2023 which showed 25 to 49% stenosis of LAD which is considered as mild, also dyslipidemia, borderline diabetes, atypical chest pain. Comes today to months for follow-up overall doing well.  She exercised on the regular basis but few weeks ago she became very busy as she has no time to exercise right now.  Denies have any chest pain tightness squeezing pressure burning chest overall doing very well.  Past Medical History:  Diagnosis Date   Abnormal Pap smear of cervix 1996   Cryo   Dysplasia of cervix 1996   cryo, no abn paps since   Hypercholesteremia 2018   Patient denies   Migraine    w/o aura   Placenta accreta    Noted with last cesarean section 2007    Past Surgical History:  Procedure Laterality Date   CESAREAN SECTION  03, 05, 07   CRYOTHERAPY N/A 1996   cervix   DILATION AND CURETTAGE OF UTERUS     times 2    Current Medications: Current Meds  Medication Sig   aspirin EC 81 MG tablet Take 1 tablet (81 mg total) by mouth daily. Swallow whole.   Cholecalciferol (VITAMIN D3) 25 MCG (1000 UT) CAPS Take 1 capsule by mouth daily.   EMGALITY 120 MG/ML SOAJ Inject 120 mg into the skin every 30 (thirty) days.   ferrous sulfate ER (SLOW FE) 142 (45 Fe) MG TBCR tablet Take 45 mg by mouth daily.   levonorgestrel (MIRENA) 20 MCG/24HR IUD 1 each by Intrauterine route once.   naproxen (NAPROSYN) 500 MG tablet Take 500 mg by mouth 2 (two) times daily with a meal.   rosuvastatin (CRESTOR) 20 MG tablet Take 1 tablet (20 mg total) by mouth daily.    SUMAtriptan (IMITREX) 100 MG tablet Take 100 mg by mouth every 2 (two) hours as needed for migraine or headache.     Allergies:   Patient has no known allergies.   Social History   Socioeconomic History   Marital status: Married    Spouse name: Not on file   Number of children: Not on file   Years of education: Not on file   Highest education level: Professional school degree (e.g., MD, DDS, DVM, JD)  Occupational History   Not on file  Tobacco Use   Smoking status: Never    Passive exposure: Never   Smokeless tobacco: Never  Vaping Use   Vaping status: Never Used  Substance and Sexual Activity   Alcohol use: No   Drug use: No   Sexual activity: Yes    Partners: Male    Birth control/protection: Surgical, I.U.D.    Comment: vasectomy/ Mirena Inserted 05/14/14  Other Topics Concern   Not on file  Social History Narrative   Not on file   Social Drivers of Health   Financial Resource Strain: Low Risk  (02/24/2023)   Overall Financial Resource Strain (CARDIA)    Difficulty of Paying Living Expenses: Not hard at all  Food Insecurity: No Food Insecurity (  02/24/2023)   Hunger Vital Sign    Worried About Running Out of Food in the Last Year: Never true    Ran Out of Food in the Last Year: Never true  Transportation Needs: No Transportation Needs (02/24/2023)   PRAPARE - Administrator, Civil Service (Medical): No    Lack of Transportation (Non-Medical): No  Physical Activity: Sufficiently Active (02/24/2023)   Exercise Vital Sign    Days of Exercise per Week: 4 days    Minutes of Exercise per Session: 60 min  Stress: No Stress Concern Present (02/24/2023)   [employer] of Occupational Health - Occupational Stress Questionnaire    Feeling of Stress : Not at all  Social Connections: Socially Integrated (02/24/2023)   Social Connection and Isolation Panel [NHANES]    Frequency of Communication with Friends and Family: More than three times a week    Frequency of  Social Gatherings with Friends and Family: Once a week    Attends Religious Services: More than 4 times per year    Active Member of [employer] or Organizations: Yes    Attends Engineer, structural: 1 to 4 times per year    Marital Status: Married     Family History: The patient's family history includes Dementia in her father; Diabetes in her father and maternal grandfather; Heart attack (age of onset: 80) in her father; Heart disease in her maternal grandmother and paternal grandfather; Hypertension in her maternal grandmother and mother; Osteoporosis in her mother. There is no history of Colon cancer, Esophageal cancer, Rectal cancer, or Stomach cancer. ROS:   Please see the history of present illness.    All 14 point review of systems negative except as described per history of present illness  EKGs/Labs/Other Studies Reviewed:    EKG Interpretation Date/Time:  Tuesday August 31 2023 08:52:58 EST Ventricular Rate:  71 PR Interval:  152 QRS Duration:  86 QT Interval:  386 QTC Calculation: 419 R Axis:   83  Text Interpretation: Normal sinus rhythm Normal ECG No previous ECGs available Confirmed by Gypsy Balsam [phone]) on 08/31/2023 9:09:46 AM    Recent Labs: 02/25/2023: ALT 23; BUN 12; Creatinine, Ser 0.61; Hemoglobin 14.3; Platelets 171.0; Potassium 4.0; Sodium 138; TSH 1.02  Recent Lipid Panel    Component Value Date/Time   CHOL 136 02/25/2023 1103   CHOL 131 05/13/2022 1123   TRIG 54.0 02/25/2023 1103   HDL 54.10 02/25/2023 1103   HDL 49 05/13/2022 1123   CHOLHDL 3 02/25/2023 1103   VLDL 10.8 02/25/2023 1103   LDLCALC 71 02/25/2023 1103   LDLCALC 70 05/13/2022 1123    Physical Exam:    VS:  BP 122/80 (BP Location: Left Arm, Patient Position: Sitting)   Pulse 71   Ht 5' 7.5" (1.715 m)   Wt 151 lb (68.5 kg)   SpO2 97%   BMI 23.30 kg/m     Wt Readings from Last 3 Encounters:  08/31/23 151 lb (68.5 kg)  02/25/23 165 lb 6.4 oz (75 kg)  09/23/22 162 lb  (73.5 kg)     GEN:  Well nourished, well developed in no acute distress HEENT: Normal NECK: No JVD; No carotid bruits LYMPHATICS: No lymphadenopathy CARDIAC: RRR, no murmurs, no rubs, no gallops RESPIRATORY:  Clear to auscultation without rales, wheezing or rhonchi  ABDOMEN: Soft, non-tender, non-distended MUSCULOSKELETAL:  No edema; No deformity  SKIN: Warm and dry LOWER EXTREMITIES: no swelling NEUROLOGIC:  Alert and oriented x 3 PSYCHIATRIC:  Normal  affect   ASSESSMENT:    1. Atypical chest pain   2. Coronary artery disease involving native coronary artery of native heart without angina pectoris   3. Hypercholesteremia   4. Prediabetes    PLAN:    In order of problems listed above:  Atypical chest pain denies having any. Coronary disease only mild disease.  Continue antiplatelet therapy continue risk factors modifications.  Likely asymptomatic. Dyslipidemia I did review her K PN which show me data from June with LDL 71 HDL 54 she is taking Crestor 20 which I will continue. Prediabetes last hemoglobin A1c from summer of this year is 5.8 I encouraged her to be more active had a long discussion about evaluation to exercise on the regular basis we did discuss need to be in good diet.   Medication Adjustments/Labs and Tests Ordered: Current medicines are reviewed at length with the patient today.  Concerns regarding medicines are outlined above.  Orders Placed This Encounter  Procedures   EKG 12-Lead   Medication changes: No orders of the defined types were placed in this encounter.   Signed, Georgeanna Lea, MD, [facility] 08/31/2023 9:19 AM    [facility]

## 2023-10-17 ENCOUNTER — Encounter: Payer: Self-pay | Admitting: Family Medicine

## 2023-10-17 DIAGNOSIS — Q892 Congenital malformations of other endocrine glands: Secondary | ICD-10-CM

## 2023-11-04 ENCOUNTER — Other Ambulatory Visit: Payer: Self-pay | Admitting: Family Medicine

## 2023-11-04 ENCOUNTER — Encounter: Payer: Self-pay | Admitting: Family Medicine

## 2023-11-04 DIAGNOSIS — Q892 Congenital malformations of other endocrine glands: Secondary | ICD-10-CM

## 2023-11-05 NOTE — Addendum Note (Signed)
 Addended by: Abbe Amsterdam C on: 11/05/2023 03:01 PM   Modules accepted: Orders

## 2023-11-09 ENCOUNTER — Encounter (INDEPENDENT_AMBULATORY_CARE_PROVIDER_SITE_OTHER): Payer: Self-pay | Admitting: Otolaryngology

## 2024-01-06 ENCOUNTER — Encounter (INDEPENDENT_AMBULATORY_CARE_PROVIDER_SITE_OTHER): Payer: Self-pay

## 2024-01-06 ENCOUNTER — Ambulatory Visit (INDEPENDENT_AMBULATORY_CARE_PROVIDER_SITE_OTHER): Payer: No Typology Code available for payment source | Admitting: Otolaryngology

## 2024-01-06 VITALS — BP 116/75 | HR 64 | Ht 67.0 in | Wt 153.0 lb

## 2024-01-06 DIAGNOSIS — R1319 Other dysphagia: Secondary | ICD-10-CM | POA: Diagnosis not present

## 2024-01-06 DIAGNOSIS — Q892 Congenital malformations of other endocrine glands: Secondary | ICD-10-CM

## 2024-01-06 DIAGNOSIS — R229 Localized swelling, mass and lump, unspecified: Secondary | ICD-10-CM

## 2024-01-06 NOTE — Patient Instructions (Addendum)
 Stop Aspirin  81mg  one week prior to surgery.  I have ordered an imaging study for you to complete prior to your next visit. Please call [facility] at [phone] to schedule your imaging if you have not received a call within 24 hours. If you are unable to complete your imaging study prior to your next scheduled visit please call our office to let us  know.

## 2024-01-06 NOTE — Progress Notes (Signed)
 Dear Dr. Geralyn Knee, Here is my assessment for our mutual patient, [name]. Thank you for allowing me the opportunity to care for your patient. Please do not hesitate to contact me should you have any other questions. Sincerely, Dr. Milon Aloe  Otolaryngology Clinic Note Referring provider: Dr. Geralyn Knee HPI:  [name] is a 58 y.o. female kindly referred by Dr. Geralyn Knee for evaluation of thyroglossal duct cyst.  Initial visit (12/2023): Patient reports: first noticed lump on her neck midline neck about 18 months ago, since then has grown. Not tender or painful. Never has gotten red or infected. She feels like rarely she has some mild compression/dysphagia with very solid foods. No problems breathing. No thyroid  issues she knows of. She has gotten a CT and an US  of her thyroid  in June 2024, noted thyroglossal duct cyst. Saw Dr. Valiant Gaul and she wanted a second opinion Patient otherwise denies: - odynophagia, aspiration episodes or PNA, unintentional weight loss, ear pain - changes in voice, shortness of breath, hemoptysis tobacco or significant alcohol history  H&N Surgery: no Personal or FHx of bleeding dz or anesthesia difficulty: no  GLP-1: no AP/AC: ASA 81  PMHx: Migraines, CAD  Tobacco: never. Alcohol: denies. Lives in [place], [place]  Independent Review of Additional Tests or Records:  Dr. Valiant Gaul (04/22/2023): Thyroglossal duct cyst consultation; no dysphagia, decided to observe.  CT Neck 03/01/2023 independently reviewed and interpreted (03/01/2023): noted midline cystic structure consistent with thyroglossal duct cyst, do not see it going into tongue musculature/invading it; no concerning LAD; does appear to have a thyroid  gland present US  Thyroid  02/26/2023: reviewed, agree with read:   PMH/Meds/All/SocHx/FamHx/ROS:   Past Medical History:  Diagnosis Date   Abnormal Pap smear of cervix 1996   Cryo   Dysplasia of cervix 1996   cryo, no abn paps since   Hypercholesteremia  2018   Patient denies   Migraine    w/o aura   Placenta accreta    Noted with last cesarean section 2007     Past Surgical History:  Procedure Laterality Date   CESAREAN SECTION  03, 05, 07   CRYOTHERAPY N/A 1996   cervix   DILATION AND CURETTAGE OF UTERUS     times 2    Family History  Problem Relation Age of Onset   Hypertension Mother    Osteoporosis Mother    Heart attack Father 72       while in [place] , bypass   Diabetes Father    Dementia Father    Hypertension Maternal Grandmother    Heart disease Maternal Grandmother        bypass   Diabetes Maternal Grandfather        questionable   Heart disease Paternal Grandfather    Colon cancer Neg Hx    Esophageal cancer Neg Hx    Rectal cancer Neg Hx    Stomach cancer Neg Hx      Social Connections: Socially Integrated (02/24/2023)   Social Connection and Isolation Panel [NHANES]    Frequency of Communication with Friends and Family: More than three times a week    Frequency of Social Gatherings with Friends and Family: Once a week    Attends Religious Services: More than 4 times per year    Active Member of [employer] or Organizations: Yes    Attends Banker Meetings: 1 to 4 times per year    Marital Status: Married      Current Outpatient Medications:    aspirin  EC  81 MG tablet, Take 1 tablet (81 mg total) by mouth daily. Swallow whole., Disp: 90 tablet, Rfl: 3   Cholecalciferol (VITAMIN D3) 25 MCG (1000 UT) CAPS, Take 1 capsule by mouth daily., Disp: , Rfl:    EMGALITY 120 MG/ML SOAJ, Inject 120 mg into the skin every 30 (thirty) days., Disp: , Rfl:    ferrous sulfate ER (SLOW FE) 142 (45 Fe) MG TBCR tablet, Take 45 mg by mouth daily., Disp: , Rfl:    levonorgestrel  (MIRENA ) 20 MCG/24HR IUD, 1 each by Intrauterine route once., Disp: , Rfl:    naproxen (NAPROSYN) 500 MG tablet, Take 500 mg by mouth 2 (two) times daily with a meal., Disp: , Rfl: 0   rosuvastatin  (CRESTOR ) 20 MG tablet, Take 1 tablet  (20 mg total) by mouth daily., Disp: 90 tablet, Rfl: 3   SUMAtriptan (IMITREX) 100 MG tablet, Take 100 mg by mouth every 2 (two) hours as needed for migraine or headache., Disp: , Rfl:    Physical Exam:   BP 116/75 (BP Location: Right Arm, Patient Position: Sitting, Cuff Size: Normal)   Pulse 64   Ht 5\' 7"  (1.702 m)   Wt 153 lb (69.4 kg)   SpO2 96%   BMI 23.96 kg/m   Salient findings:  CN II-XII intact  Bilateral EAC clear and TM intact with well pneumatized middle ear spaces Anterior rhinoscopy: Septum relatively midline; bilateral inferior turbinates without significant hypertrophy No lesions of oral cavity/oropharynx; no lingual thyroid  No obviously palpable lymphadenopathy/thyromegaly; midline soft neck mass around hyoid, fairly well circumscribed No respiratory distress or stridor; TFL was indicated to better evaluate the proximal airway, given the patient's history and exam findings, and is detailed below.  Seprately Identifiable Procedures:  Prior to initiating any procedures, risks/benefits/alternatives were explained to the patient and verbal consent obtained. Procedure Note Pre-procedure diagnosis:  Dysphagia, neck mass, rule out airway involvement Post-procedure diagnosis: Same Procedure: Transnasal Fiberoptic Laryngoscopy, CPT 31575 - Mod 25 Indication: see above Complications: None apparent EBL: 0 mL  The procedure was undertaken to further evaluate the patient's complaints above, with mirror exam inadequate for appropriate examination due to gag reflex and poor patient tolerance  Procedure:  Patient was identified as correct patient. Verbal consent was obtained. The nose was sprayed with oxymetazoline and 4% lidocaine. The The flexible laryngoscope was passed through the nose to view the nasal cavity, pharynx (oropharynx, hypopharynx) and larynx.  The larynx was examined at rest and during multiple phonatory tasks. Documentation was obtained and reviewed with patient.  The scope was removed. The patient tolerated the procedure well.  Findings: The nasal cavity and nasopharynx did not reveal any masses or lesions, mucosa appeared to be without obvious lesions. The tongue base, pharyngeal walls, piriform sinuses, vallecula, epiglottis and postcricoid region are normal in appearance. No vallecular or tongue base effacement. The visualized portion of the subglottis and proximal trachea is widely patent. The vocal folds are mobile bilaterally. There are no lesions on the free edge of the vocal folds nor elsewhere in the larynx worrisome for malignancy.         Electronically signed by: Evelina Hippo, MD 01/06/2024 12:10 PM   Impression & Plans:  [name] is a 58 y.o. female with:  1. Thyroglossal duct cyst   2. Other dysphagia    Noted thyroglossal duct cyst which is getting bigger. We discussed options including observation and resection using Sistrunk procedure. She would like to have it removed given growth.  She also  has rare dysphagia with solids, do not suspect related to TGDC. Will observe.  Will need to hold ASA 81 7 days before Will schedule summer 2025 when she'd like Will get CT Neck prior given growth to ensure this is not significantly bigger or going into deep tongue musculature F/u POD 5  See below regarding exact medications prescribed this encounter including dosages and route: No orders of the defined types were placed in this encounter.     Thank you for allowing me the opportunity to care for your patient. Please do not hesitate to contact me should you have any other questions.  Sincerely, Milon Aloe, MD Otolaryngologist (ENT), [facility] Phone: [phone] Fax: [phone]  01/06/2024, 12:10 PM   MDM:  Level 4 Complexity/Problems addressed: mod - chronic problem, worsening Data complexity: mod - independent interpretation of imaging, review of notes - Morbidity: mod - decision for surgery -  Prescription Drug prescribed or managed: no

## 2024-01-13 ENCOUNTER — Telehealth (HOSPITAL_BASED_OUTPATIENT_CLINIC_OR_DEPARTMENT_OTHER): Payer: Self-pay

## 2024-02-09 ENCOUNTER — Other Ambulatory Visit: Payer: Self-pay

## 2024-02-09 MED ORDER — SULFAMETHOXAZOLE-TRIMETHOPRIM 800-160 MG PO TABS: 1.0000 | ORAL_TABLET | Freq: Two times a day (BID) | ORAL | 0 refills | Status: DC

## 2024-02-09 NOTE — Telephone Encounter (Signed)
 Patient called states that she did have a standing rx for bactrim  for UTI's for years but it has expired. She states that she is having frequency and cloudy urin.  Former medication pended. Please advise.

## 2024-03-06 ENCOUNTER — Ambulatory Visit (HOSPITAL_BASED_OUTPATIENT_CLINIC_OR_DEPARTMENT_OTHER)
Admission: RE | Admit: 2024-03-06 | Discharge: 2024-03-06 | Disposition: A | Discharge status: Home / Self Care | Source: Ambulatory Visit | Attending: Otolaryngology | Admitting: Otolaryngology

## 2024-03-06 DIAGNOSIS — Q892 Congenital malformations of other endocrine glands: Secondary | ICD-10-CM | POA: Diagnosis present

## 2024-03-06 MED ORDER — IOHEXOL 300 MG/ML  SOLN
75.0000 mL | Freq: Once | INTRAMUSCULAR | Status: AC | PRN
  Administered 2024-03-06: 75 mL via INTRAVENOUS

## 2024-03-08 ENCOUNTER — Other Ambulatory Visit (HOSPITAL_BASED_OUTPATIENT_CLINIC_OR_DEPARTMENT_OTHER)

## 2024-03-21 NOTE — Progress Notes (Deleted)
 [facility] [address] [place], [place] [address] [phone]  Date:  03/23/2024   Name:  [name]   DOB:  [birth year]   MRN:  [id number]  PCP:  Watt Harlene JAYSON, MD    Chief Complaint: No chief complaint on file.   History of Present Illness:  [name] is a 58 y.o. very pleasant female patient who presents with the following:  Patient seen today with concern about her mood.  Most recent visit with myself was about 1 year ago  She has history of prediabetes, migraine headache, mild coronary disease seen on coronary CT.  When I saw her last year she had some alopecia areata on her head We also found a thyroglossal cyst in her neck last year-she had a ENT consult with Dr. Eldora Blanch in April of this year: 1. Thyroglossal duct cyst   2. Other dysphagia     Noted thyroglossal duct cyst which is getting bigger. We discussed options including observation and resection using Sistrunk procedure. She would like to have it removed given growth.  She also has rare dysphagia with solids, do not suspect related to TGDC. Will observe.  Will need to hold ASA 81 7 days before Will schedule summer 2025 when she'd like Will get CT Neck prior given growth to ensure this is not significantly bigger or going into deep tongue musculature  She also saw her cardiologist, Dr. MARLA in December  Patient Active Problem List   Diagnosis Date Noted   Thyroglossal duct cyst 06/09/2023   Coronary artery disease 25 to 49% stenosis of LAD based on coronary CT angio in 2023 12/05/2021   Atypical chest pain 10/14/2021   Placenta accreta 10/08/2021   Migraine 10/08/2021   Prediabetes 09/18/2021   Vitamin D  deficiency 01/14/2021   Hx of migraine headaches 03/03/2017   Hypercholesteremia 2018   Dysplasia of cervix 1996   Abnormal Pap smear of cervix 1996    Past Medical History:  Diagnosis Date   Abnormal Pap smear of cervix 1996    Cryo   Dysplasia of cervix 1996   cryo, no abn paps since   Hypercholesteremia 2018   Patient denies   Migraine    w/o aura   Placenta accreta    Noted with last cesarean section 2007    Past Surgical History:  Procedure Laterality Date   CESAREAN SECTION  03, 05, 07   CRYOTHERAPY N/A 1996   cervix   DILATION AND CURETTAGE OF UTERUS     times 2    Social History   Tobacco Use   Smoking status: Never    Passive exposure: Never   Smokeless tobacco: Never  Vaping Use   Vaping status: Never Used  Substance Use Topics   Alcohol use: No   Drug use: No    Family History  Problem Relation Age of Onset   Hypertension Mother    Osteoporosis Mother    Heart attack Father 72       while in [place] , bypass   Diabetes Father    Dementia Father    Hypertension Maternal Grandmother    Heart disease Maternal Grandmother        bypass   Diabetes Maternal Grandfather        questionable   Heart disease Paternal Grandfather    Colon cancer Neg Hx    Esophageal cancer Neg Hx    Rectal  cancer Neg Hx    Stomach cancer Neg Hx     No Known Allergies  Medication list has been reviewed and updated.  Current Outpatient Medications on File Prior to Visit  Medication Sig Dispense Refill   aspirin  EC 81 MG tablet Take 1 tablet (81 mg total) by mouth daily. Swallow whole. 90 tablet 3   Cholecalciferol (VITAMIN D3) 25 MCG (1000 UT) CAPS Take 1 capsule by mouth daily.     EMGALITY 120 MG/ML SOAJ Inject 120 mg into the skin every 30 (thirty) days.     ferrous sulfate ER (SLOW FE) 142 (45 Fe) MG TBCR tablet Take 45 mg by mouth daily.     levonorgestrel  (MIRENA ) 20 MCG/24HR IUD 1 each by Intrauterine route once.     naproxen (NAPROSYN) 500 MG tablet Take 500 mg by mouth 2 (two) times daily with a meal.  0   rosuvastatin  (CRESTOR ) 20 MG tablet Take 1 tablet (20 mg total) by mouth daily. 90 tablet 3   sulfamethoxazole -trimethoprim  (BACTRIM  DS) 800-160 MG tablet Take 1 tablet by  mouth 2 (two) times daily. 14 tablet 0   SUMAtriptan (IMITREX) 100 MG tablet Take 100 mg by mouth every 2 (two) hours as needed for migraine or headache.     No current facility-administered medications on file prior to visit.    Review of Systems:  As per HPI- otherwise negative.    Physical Examination: There were no vitals filed for this visit. There were no vitals filed for this visit. There is no height or weight on file to calculate BMI. Ideal Body Weight:    GEN: no acute distress. HEENT: Atraumatic, Normocephalic.  Ears and Nose: No external deformity. CV: RRR, No M/G/R. No JVD. No thrill. No extra heart sounds. PULM: CTA B, no wheezes, crackles, rhonchi. No retractions. No resp. distress. No accessory muscle use. ABD: S, NT, ND, +BS. No rebound. No HSM. EXTR: No c/c/e PSYCH: Normally interactive. Conversant.    Assessment and Plan: ***  Signed Harlene Schroeder, MD

## 2024-03-23 ENCOUNTER — Ambulatory Visit: Admitting: Family Medicine

## 2024-04-07 ENCOUNTER — Encounter (INDEPENDENT_AMBULATORY_CARE_PROVIDER_SITE_OTHER): Payer: Self-pay | Admitting: Otolaryngology

## 2024-04-07 ENCOUNTER — Other Ambulatory Visit: Payer: Self-pay

## 2024-04-07 ENCOUNTER — Telehealth (INDEPENDENT_AMBULATORY_CARE_PROVIDER_SITE_OTHER): Payer: Self-pay | Admitting: Otolaryngology

## 2024-04-07 DIAGNOSIS — Q892 Congenital malformations of other endocrine glands: Secondary | ICD-10-CM | POA: Diagnosis not present

## 2024-04-07 HISTORY — PX: THYROGLOSSAL DUCT CYST: SHX297

## 2024-04-07 MED ORDER — CEPHALEXIN 500 MG PO CAPS: 500.0000 mg | ORAL_CAPSULE | Freq: Two times a day (BID) | ORAL | 0 refills | Status: AC

## 2024-04-07 MED ORDER — ACETAMINOPHEN 500 MG PO TABS: 1000.0000 mg | ORAL_TABLET | Freq: Four times a day (QID) | ORAL | 0 refills | Status: DC | PRN

## 2024-04-07 MED ORDER — CEPHALEXIN 500 MG PO CAPS: 500.0000 mg | ORAL_CAPSULE | Freq: Two times a day (BID) | ORAL | 0 refills | Status: DC

## 2024-04-07 MED ORDER — OXYCODONE HCL 5 MG PO TABS: 5.0000 mg | ORAL_TABLET | ORAL | 0 refills | Status: DC | PRN

## 2024-04-07 MED ORDER — BACITRACIN 500 UNIT/GM EX OINT: 1.0000 | TOPICAL_OINTMENT | Freq: Two times a day (BID) | CUTANEOUS | 0 refills | Status: DC

## 2024-04-07 MED ORDER — IBUPROFEN 200 MG PO CAPS: 400.0000 mg | ORAL_CAPSULE | Freq: Four times a day (QID) | ORAL | 0 refills | Status: DC | PRN

## 2024-04-07 MED ORDER — OXYCODONE HCL 5 MG PO TABS: 5.0000 mg | ORAL_TABLET | ORAL | 0 refills | Status: AC | PRN

## 2024-04-07 MED ORDER — IBUPROFEN 200 MG PO CAPS: 400.0000 mg | ORAL_CAPSULE | Freq: Four times a day (QID) | ORAL | 0 refills | Status: AC | PRN

## 2024-04-07 NOTE — Addendum Note (Signed)
 Addended by: Calli Bashor on: 04/07/2024 12:20 PM   Modules accepted: Orders

## 2024-04-07 NOTE — Telephone Encounter (Addendum)
 ENT Note: Sistrunk procedure done at [facility] on 04/07/2024. Will Rx tylenol, ibuprofen, and oxycodone and Keflex  500mg  BID x5d for pain. F/u scheduled for drain removal [name]

## 2024-04-10 ENCOUNTER — Ambulatory Visit (INDEPENDENT_AMBULATORY_CARE_PROVIDER_SITE_OTHER): Admitting: Physician Assistant

## 2024-04-10 ENCOUNTER — Encounter (INDEPENDENT_AMBULATORY_CARE_PROVIDER_SITE_OTHER): Payer: Self-pay | Admitting: Physician Assistant

## 2024-04-10 VITALS — BP 122/78 | HR 70

## 2024-04-10 DIAGNOSIS — Z9889 Other specified postprocedural states: Secondary | ICD-10-CM

## 2024-04-10 DIAGNOSIS — Q892 Congenital malformations of other endocrine glands: Secondary | ICD-10-CM

## 2024-04-10 LAB — SURGICAL PATHOLOGY

## 2024-04-10 NOTE — Progress Notes (Signed)
 Dear Dr. Watt, Here is my assessment for our mutual patient, [name]. Thank you for allowing me the opportunity to care for your patient. Please do not hesitate to contact me should you have any other questions. Sincerely, Chyrl Cohen PA-C  Otolaryngology Clinic Note Referring provider: Dr. Watt HPI:  [name] is a 58 y.o. female kindly referred by Dr. Watt   The patient is a 58 year old female status post Sistrunk procedure for thyroglossal duct cyst on 04/07/2024 by Dr. Tobie.  Postoperatively she notes she is doing well.  She notes pain is controlled, she denies any infectious signs or symptoms.  She notes she has been changing the dressing over the drain site every few hours.  She notes that postoperatively she has had some hoarseness of her voice and did originally have some pain with swallowing which is improving.  She also notes some numbness around the incision site and some asymmetry of the smile on her right lower lip.  She denies any difficult tongue movement.  Pathology not available at the time of evaluation.  Independent Review of Additional Tests or Records:  None   PMH/Meds/All/SocHx/FamHx/ROS:   Past Medical History:  Diagnosis Date   Abnormal Pap smear of cervix 1996   Cryo   Dysplasia of cervix 1996   cryo, no abn paps since   Hypercholesteremia 2018   Patient denies   Migraine    w/o aura   Placenta accreta    Noted with last cesarean section 2007     Past Surgical History:  Procedure Laterality Date   CESAREAN SECTION  03, 05, 07   CRYOTHERAPY N/A 1996   cervix   DILATION AND CURETTAGE OF UTERUS     times 2    Family History  Problem Relation Age of Onset   Hypertension Mother    Osteoporosis Mother    Heart attack Father 38       while in [place] , bypass   Diabetes Father    Dementia Father    Hypertension Maternal Grandmother    Heart disease Maternal Grandmother        bypass   Diabetes Maternal Grandfather         questionable   Heart disease Paternal Grandfather    Colon cancer Neg Hx    Esophageal cancer Neg Hx    Rectal cancer Neg Hx    Stomach cancer Neg Hx      Social Connections: Socially Integrated (02/24/2023)   Social Connection and Isolation Panel    Frequency of Communication with Friends and Family: More than three times a week    Frequency of Social Gatherings with Friends and Family: Once a week    Attends Religious Services: More than 4 times per year    Active Member of [employer] or Organizations: Yes    Attends Banker Meetings: 1 to 4 times per year    Marital Status: Married      Current Outpatient Medications:    acetaminophen  (TYLENOL ) 500 MG tablet, Take 2 tablets (1,000 mg total) by mouth every 6 (six) hours as needed., Disp: 30 tablet, Rfl: 0   aspirin  EC 81 MG tablet, Take 1 tablet (81 mg total) by mouth daily. Swallow whole., Disp: 90 tablet, Rfl: 3   cephALEXin  (KEFLEX ) 500 MG capsule, Take 1 capsule (500 mg total) by mouth 2 (two) times daily for 5 days., Disp: 10 capsule, Rfl: 0   Cholecalciferol (VITAMIN D3) 25 MCG (1000 UT) CAPS, Take 1 capsule  by mouth daily., Disp: , Rfl:    EMGALITY 120 MG/ML SOAJ, Inject 120 mg into the skin every 30 (thirty) days., Disp: , Rfl:    ferrous sulfate ER (SLOW FE) 142 (45 Fe) MG TBCR tablet, Take 45 mg by mouth daily., Disp: , Rfl:    Ibuprofen  200 MG CAPS, Take 2 capsules (400 mg total) by mouth every 6 (six) hours as needed for up to 14 days., Disp: 120 capsule, Rfl: 0   levonorgestrel  (MIRENA ) 20 MCG/24HR IUD, 1 each by Intrauterine route once., Disp: , Rfl:    naproxen (NAPROSYN) 500 MG tablet, Take 500 mg by mouth 2 (two) times daily with a meal., Disp: , Rfl: 0   oxyCODONE  (ROXICODONE ) 5 MG immediate release tablet, Take 1 tablet (5 mg total) by mouth every 4 (four) hours as needed for up to 7 days for severe pain (pain score 7-10)., Disp: 25 tablet, Rfl: 0   rosuvastatin  (CRESTOR ) 20 MG tablet, Take 1 tablet (20 mg  total) by mouth daily., Disp: 90 tablet, Rfl: 3   sulfamethoxazole -trimethoprim  (BACTRIM  DS) 800-160 MG tablet, Take 1 tablet by mouth 2 (two) times daily., Disp: 14 tablet, Rfl: 0   SUMAtriptan (IMITREX) 100 MG tablet, Take 100 mg by mouth every 2 (two) hours as needed for migraine or headache., Disp: , Rfl:    Physical Exam:   BP 122/78   Pulse 70   SpO2 96%   Pertinent Findings  CN II-XII intact-weakness of the right lateral lower lip, numbness along the lower chin and incision Incision is clean dry and intact with Dermabond in place, no surrounding redness swelling or warmth to touch, drain site is clean with drain intact Tongue movement intact Voice slightly hoarse No obviously palpable neck masses/lymphadenopathy/thyromegaly No respiratory distress or stridor  Seprately Identifiable Procedures:  None  Impression & Plans:  Quantavia White is a 58 y.o. female with the following   Postop follow-up status post Sistrunk procedure for thyroglossal duct cyst-  Pathology has not returned yet.  The patient does note some minimal hoarseness which could likely be related to intubation, we will continue to follow this.  She also does have some weakness of the right marginal mandibular nerve.  Low likelihood this was injured during surgery given location. We will continue to monitor this as it will likely improve with time. Pt will follow up with Dr. Tobie in 2 weeks. Sooner as needed. Once pathology is available we will reach out to her. Return precautions given.    - f/u 2 weeks with Dr. Tobie   Thank you for allowing me the opportunity to care for your patient. Please do not hesitate to contact me should you have any other questions.  Sincerely, Chyrl Cohen PA-C [facility] Phone: [phone] Fax: [phone]  04/10/2024, 9:21 AM

## 2024-04-12 ENCOUNTER — Encounter (INDEPENDENT_AMBULATORY_CARE_PROVIDER_SITE_OTHER): Admitting: Otolaryngology

## 2024-04-22 NOTE — Progress Notes (Signed)
 [facility] [address], [address] [place], [place] [address] [id number]  Date:  04/24/2024   Name:  [name]   DOB:  19-Oct-1965   MRN:  [id number]  PCP:  Watt Harlene JAYSON, MD    Chief Complaint: No chief complaint on file.   History of Present Illness:  [name] is a 58 y.o. very pleasant female patient who presents with the following:  Patient seen today with concerns of depression.  Most recent visit with myself was June 2024 for concern of alopecia areata.  History of mild CAD on CT coronary angio, prediabetes, hyperlipidemia, history of migraines She also has been dealing with thyroglossal duct cyst-she had a surgical removal in July Seen by her cardiologist, Dr. Bernie in December  Patient Active Problem List   Diagnosis Date Noted   Thyroglossal duct cyst 06/09/2023   Coronary artery disease 25 to 49% stenosis of LAD based on coronary CT angio in 2023 12/05/2021   Atypical chest pain 10/14/2021   Placenta accreta 10/08/2021   Migraine 10/08/2021   Prediabetes 09/18/2021   Vitamin D  deficiency 01/14/2021   Hx of migraine headaches 03/03/2017   Hypercholesteremia 2018   Dysplasia of cervix 1996   Abnormal Pap smear of cervix 1996    Past Medical History:  Diagnosis Date   Abnormal Pap smear of cervix 1996   Cryo   Dysplasia of cervix 1996   cryo, no abn paps since   Hypercholesteremia 2018   Patient denies   Migraine    w/o aura   Placenta accreta    Noted with last cesarean section 2007    Past Surgical History:  Procedure Laterality Date   CESAREAN SECTION  03, 05, 07   CRYOTHERAPY N/A 1996   cervix   DILATION AND CURETTAGE OF UTERUS     times 2    Social History   Tobacco Use   Smoking status: Never    Passive exposure: Never   Smokeless tobacco: Never  Vaping Use   Vaping status: Never Used  Substance Use Topics   Alcohol use: No   Drug use: No    Family History   Problem Relation Age of Onset   Hypertension Mother    Osteoporosis Mother    Heart attack Father 66       while in [place] , bypass   Diabetes Father    Dementia Father    Hypertension Maternal Grandmother    Heart disease Maternal Grandmother        bypass   Diabetes Maternal Grandfather        questionable   Heart disease Paternal Grandfather    Colon cancer Neg Hx    Esophageal cancer Neg Hx    Rectal cancer Neg Hx    Stomach cancer Neg Hx     No Known Allergies  Medication list has been reviewed and updated.  Current Outpatient Medications on File Prior to Visit  Medication Sig Dispense Refill   acetaminophen  (TYLENOL ) 500 MG tablet Take 2 tablets (1,000 mg total) by mouth every 6 (six) hours as needed. 30 tablet 0   aspirin  EC 81 MG tablet Take 1 tablet (81 mg total) by mouth daily. Swallow whole. 90 tablet 3   Cholecalciferol (VITAMIN D3) 25 MCG (1000 UT) CAPS Take 1 capsule by mouth daily.     EMGALITY 120 MG/ML SOAJ Inject 120 mg into the skin every 30 (thirty) days.  ferrous sulfate ER (SLOW FE) 142 (45 Fe) MG TBCR tablet Take 45 mg by mouth daily.     levonorgestrel  (MIRENA ) 20 MCG/24HR IUD 1 each by Intrauterine route once.     naproxen (NAPROSYN) 500 MG tablet Take 500 mg by mouth 2 (two) times daily with a meal.  0   rosuvastatin  (CRESTOR ) 20 MG tablet Take 1 tablet (20 mg total) by mouth daily. 90 tablet 3   sulfamethoxazole -trimethoprim  (BACTRIM  DS) 800-160 MG tablet Take 1 tablet by mouth 2 (two) times daily. 14 tablet 0   SUMAtriptan (IMITREX) 100 MG tablet Take 100 mg by mouth every 2 (two) hours as needed for migraine or headache.     No current facility-administered medications on file prior to visit.    Review of Systems:  As per HPI- otherwise negative.   Physical Examination: There were no vitals filed for this visit. There were no vitals filed for this visit. There is no height or weight on file to calculate BMI. Ideal Body Weight:     GEN: no acute distress. HEENT: Atraumatic, Normocephalic.  Ears and Nose: No external deformity. CV: RRR, No M/G/R. No JVD. No thrill. No extra heart sounds. PULM: CTA B, no wheezes, crackles, rhonchi. No retractions. No resp. distress. No accessory muscle use. ABD: S, NT, ND, +BS. No rebound. No HSM. EXTR: No c/c/e PSYCH: Normally interactive. Conversant.    Assessment and Plan: ***  Signed Harlene Schroeder, MD

## 2024-04-23 ENCOUNTER — Telehealth: Admitting: Family

## 2024-04-23 DIAGNOSIS — R399 Unspecified symptoms and signs involving the genitourinary system: Secondary | ICD-10-CM | POA: Diagnosis not present

## 2024-04-23 MED ORDER — CEPHALEXIN 500 MG PO CAPS: 500.0000 mg | ORAL_CAPSULE | Freq: Two times a day (BID) | ORAL | 0 refills | Status: DC

## 2024-04-23 NOTE — Progress Notes (Signed)

## 2024-04-24 ENCOUNTER — Encounter: Payer: Self-pay | Admitting: Family Medicine

## 2024-04-24 ENCOUNTER — Ambulatory Visit (INDEPENDENT_AMBULATORY_CARE_PROVIDER_SITE_OTHER): Admitting: Family Medicine

## 2024-04-24 VITALS — BP 124/88 | HR 76 | Resp 16 | Ht 67.0 in | Wt 160.6 lb

## 2024-04-24 DIAGNOSIS — F4323 Adjustment disorder with mixed anxiety and depressed mood: Secondary | ICD-10-CM

## 2024-04-24 MED ORDER — SERTRALINE HCL 50 MG PO TABS
50.0000 mg | ORAL_TABLET | Freq: Every day | ORAL | 3 refills | Status: DC
Start: 2024-04-24 — End: 2024-07-24

## 2024-04-24 NOTE — Patient Instructions (Signed)
 Good to see you today- I am sorry you are feeling down!  Let's start on sertraline  50 mg daily- can increase to 100 mg after 1-2 weeks as desired.  Please update me in 3-4 weeks or sooner if you are not doing ok!

## 2024-04-25 ENCOUNTER — Encounter: Payer: Self-pay | Admitting: Obstetrics and Gynecology

## 2024-04-25 ENCOUNTER — Other Ambulatory Visit: Payer: Self-pay | Admitting: Obstetrics and Gynecology

## 2024-04-25 MED ORDER — SULFAMETHOXAZOLE-TRIMETHOPRIM 800-160 MG PO TABS: 1.0000 | ORAL_TABLET | Freq: Two times a day (BID) | ORAL | 0 refills | Status: DC

## 2024-05-02 ENCOUNTER — Ambulatory Visit (INDEPENDENT_AMBULATORY_CARE_PROVIDER_SITE_OTHER): Admitting: Otolaryngology

## 2024-05-02 ENCOUNTER — Encounter (INDEPENDENT_AMBULATORY_CARE_PROVIDER_SITE_OTHER): Payer: Self-pay | Admitting: Otolaryngology

## 2024-05-02 VITALS — BP 125/81 | HR 61 | Ht 67.0 in | Wt 160.0 lb

## 2024-05-02 DIAGNOSIS — Z9889 Other specified postprocedural states: Secondary | ICD-10-CM

## 2024-05-02 DIAGNOSIS — Q892 Congenital malformations of other endocrine glands: Secondary | ICD-10-CM

## 2024-05-02 DIAGNOSIS — R1319 Other dysphagia: Secondary | ICD-10-CM

## 2024-05-02 NOTE — Patient Instructions (Addendum)
 Silicone scar cream or sheets - scar away

## 2024-05-02 NOTE — Progress Notes (Signed)
 Dear Dr. Watt, Here is my assessment for our mutual patient, [name]. Thank you for allowing me the opportunity to care for your patient. Please do not hesitate to contact me should you have any other questions. Sincerely, Dr. Eldora Blanch  Otolaryngology Clinic Note Referring provider: Dr. Watt HPI:  [name] is a 58 y.o. female kindly referred by Dr. Watt for evaluation of thyroglossal duct cyst.  Initial visit (12/2023): Patient reports: first noticed lump on her neck midline neck about 18 months ago, since then has grown. Not tender or painful. Never has gotten red or infected. She feels like rarely she has some mild compression/dysphagia with very solid foods. No problems breathing. No thyroid  issues she knows of. She has gotten a CT and an US  of her thyroid  in June 2024, noted thyroglossal duct cyst. Saw Dr. Burnie and she wanted a second opinion Patient otherwise denies: - odynophagia, aspiration episodes or PNA, unintentional weight loss, ear pain - changes in voice, shortness of breath, hemoptysis tobacco or significant alcohol history  --------------------------------------------------------- 05/02/2024 Follow up after sistrunk procedure. Swallowing and breathing well. Voice back to normal. Incision healing well, no issues. Lip weakness essentially resolved.   H&N Surgery: no Personal or FHx of bleeding dz or anesthesia difficulty: no  GLP-1: no AP/AC: ASA 81  PMHx: Migraines, CAD  Tobacco: never. Alcohol: denies. Lives in [place], [place]  Independent Review of Additional Tests or Records:  Dr. Burnie (04/22/2023): Thyroglossal duct cyst consultation; no dysphagia, decided to observe.  CT Neck 03/01/2023 independently reviewed and interpreted (03/01/2023): noted midline cystic structure consistent with thyroglossal duct cyst, do not see it going into tongue musculature/invading it; no concerning LAD; does appear to have a thyroid  gland present US  Thyroid   02/26/2023: reviewed, agree with read:   PMH/Meds/All/SocHx/FamHx/ROS:   Past Medical History:  Diagnosis Date   Abnormal Pap smear of cervix 1996   Cryo   Dysplasia of cervix 1996   cryo, no abn paps since   Hypercholesteremia 2018   Patient denies   Migraine    w/o aura   Placenta accreta    Noted with last cesarean section 2007     Past Surgical History:  Procedure Laterality Date   CESAREAN SECTION  03, 05, 07   CRYOTHERAPY N/A 1996   cervix   DILATION AND CURETTAGE OF UTERUS     times 2    Family History  Problem Relation Age of Onset   Hypertension Mother    Osteoporosis Mother    Heart attack Father 23       while in [place] , bypass   Diabetes Father    Dementia Father    Hypertension Maternal Grandmother    Heart disease Maternal Grandmother        bypass   Diabetes Maternal Grandfather        questionable   Heart disease Paternal Grandfather    Colon cancer Neg Hx    Esophageal cancer Neg Hx    Rectal cancer Neg Hx    Stomach cancer Neg Hx      Social Connections: Socially Integrated (02/24/2023)   Social Connection and Isolation Panel    Frequency of Communication with Friends and Family: More than three times a week    Frequency of Social Gatherings with Friends and Family: Once a week    Attends Religious Services: More than 4 times per year    Active Member of [employer] or Organizations: Yes    Attends Banker Meetings:  1 to 4 times per year    Marital Status: Married      Current Outpatient Medications:    aspirin  EC 81 MG tablet, Take 1 tablet (81 mg total) by mouth daily. Swallow whole., Disp: 90 tablet, Rfl: 3   cephALEXin  (KEFLEX ) 500 MG capsule, Take 1 capsule (500 mg total) by mouth 2 (two) times daily., Disp: 14 capsule, Rfl: 0   Cholecalciferol (VITAMIN D3) 25 MCG (1000 UT) CAPS, Take 1 capsule by mouth daily., Disp: , Rfl:    EMGALITY 120 MG/ML SOAJ, Inject 120 mg into the skin every 30 (thirty) days., Disp: , Rfl:     ferrous sulfate ER (SLOW FE) 142 (45 Fe) MG TBCR tablet, Take 45 mg by mouth daily., Disp: , Rfl:    levonorgestrel  (MIRENA ) 20 MCG/24HR IUD, 1 each by Intrauterine route once., Disp: , Rfl:    naproxen (NAPROSYN) 500 MG tablet, Take 500 mg by mouth 2 (two) times daily with a meal., Disp: , Rfl: 0   rosuvastatin  (CRESTOR ) 20 MG tablet, Take 1 tablet (20 mg total) by mouth daily., Disp: 90 tablet, Rfl: 3   sertraline  (ZOLOFT ) 50 MG tablet, Take 1 tablet (50 mg total) by mouth daily. Increase to 100 mg after 1-2 weeks if desired, Disp: 60 tablet, Rfl: 3   sulfamethoxazole -trimethoprim  (BACTRIM  DS) 800-160 MG tablet, Take 1 tablet by mouth 2 (two) times daily. One PO BID x 3 days, Disp: 6 tablet, Rfl: 0   SUMAtriptan (IMITREX) 100 MG tablet, Take 100 mg by mouth every 2 (two) hours as needed for migraine or headache., Disp: , Rfl:    Physical Exam:   BP 125/81 (BP Location: Left Arm, Patient Position: Sitting, Cuff Size: Normal)   Pulse 61   Ht 5' 7 (1.702 m)   Wt 160 lb (72.6 kg)   SpO2 96%   BMI 25.06 kg/m   Salient findings:  CN II-XII intact  Midline neck incision well healed, voice strong, very trace right lower lip weakness No respiratory distress or stridor; easily tolerates secretions  Seprately Identifiable Procedures:  Prior to initiating any procedures, risks/benefits/alternatives were explained to the patient and verbal consent obtained. Procedure Note (not today) Pre-procedure diagnosis:  Dysphagia, neck mass, rule out airway involvement Post-procedure diagnosis: Same Procedure: Transnasal Fiberoptic Laryngoscopy, CPT 31575 - Mod 25 Indication: see above Complications: None apparent EBL: 0 mL  The procedure was undertaken to further evaluate the patient's complaints above, with mirror exam inadequate for appropriate examination due to gag reflex and poor patient tolerance  Procedure:  Patient was identified as correct patient. Verbal consent was obtained. The nose was  sprayed with oxymetazoline and 4% lidocaine. The The flexible laryngoscope was passed through the nose to view the nasal cavity, pharynx (oropharynx, hypopharynx) and larynx.  The larynx was examined at rest and during multiple phonatory tasks. Documentation was obtained and reviewed with patient. The scope was removed. The patient tolerated the procedure well.  Findings: The nasal cavity and nasopharynx did not reveal any masses or lesions, mucosa appeared to be without obvious lesions. The tongue base, pharyngeal walls, piriform sinuses, vallecula, epiglottis and postcricoid region are normal in appearance. No vallecular or tongue base effacement. The visualized portion of the subglottis and proximal trachea is widely patent. The vocal folds are mobile bilaterally. There are no lesions on the free edge of the vocal folds nor elsewhere in the larynx worrisome for malignancy.         Electronically signed by: Eldora KATHEE Blanch,  MD 05/02/2024 11:05 AM   Impression & Plans:  Mikel Pyon is a 58 y.o. female with:  1. Thyroglossal duct cyst   2. Other dysphagia    S/p excision; doing well, dysphagia resolved. Discussed incision care with sunscreen and silicone scar sheets F/u as needed, should she have any issues She is quite happy with the results  See below regarding exact medications prescribed this encounter including dosages and route: No orders of the defined types were placed in this encounter.     Thank you for allowing me the opportunity to care for your patient. Please do not hesitate to contact me should you have any other questions.  Sincerely, Eldora Blanch, MD Otolaryngologist (ENT), [facility] Phone: [phone] Fax: [phone]  05/02/2024, 11:05 AM   [phone]

## 2024-05-22 ENCOUNTER — Encounter: Payer: Self-pay | Admitting: Family Medicine

## 2024-05-22 DIAGNOSIS — L659 Nonscarring hair loss, unspecified: Secondary | ICD-10-CM

## 2024-05-30 ENCOUNTER — Encounter: Payer: Self-pay | Admitting: Family Medicine

## 2024-05-30 ENCOUNTER — Other Ambulatory Visit (INDEPENDENT_AMBULATORY_CARE_PROVIDER_SITE_OTHER)

## 2024-05-30 DIAGNOSIS — L659 Nonscarring hair loss, unspecified: Secondary | ICD-10-CM | POA: Diagnosis not present

## 2024-05-30 DIAGNOSIS — E611 Iron deficiency: Secondary | ICD-10-CM

## 2024-05-30 LAB — FERRITIN: Ferritin: 89.8 ng/mL (ref 10.0–291.0)

## 2024-05-31 NOTE — Addendum Note (Signed)
 Addended by: WATT RAISIN C on: 05/31/2024 12:20 PM   Modules accepted: Orders

## 2024-06-15 ENCOUNTER — Ambulatory Visit: Admitting: Obstetrics and Gynecology

## 2024-06-15 ENCOUNTER — Encounter: Payer: Self-pay | Admitting: Obstetrics and Gynecology

## 2024-06-15 VITALS — BP 122/84 | HR 63 | Ht 67.75 in | Wt 162.0 lb

## 2024-06-15 DIAGNOSIS — N951 Menopausal and female climacteric states: Secondary | ICD-10-CM

## 2024-06-15 DIAGNOSIS — Z1331 Encounter for screening for depression: Secondary | ICD-10-CM | POA: Diagnosis not present

## 2024-06-15 DIAGNOSIS — Z01419 Encounter for gynecological examination (general) (routine) without abnormal findings: Secondary | ICD-10-CM

## 2024-06-15 LAB — ESTRADIOL: Estradiol: 23 pg/mL

## 2024-06-15 LAB — FOLLICLE STIMULATING HORMONE: FSH: 82.9 m[IU]/mL

## 2024-06-15 MED ORDER — SULFAMETHOXAZOLE-TRIMETHOPRIM 800-160 MG PO TABS: 1.0000 | ORAL_TABLET | Freq: Two times a day (BID) | ORAL | 0 refills | Status: DC

## 2024-06-15 MED ORDER — ESTRADIOL 0.05 MG/24HR TD PTTW: 1.0000 | MEDICATED_PATCH | TRANSDERMAL | 1 refills | Status: DC

## 2024-06-15 NOTE — Patient Instructions (Signed)

## 2024-06-15 NOTE — Progress Notes (Signed)
 58 y.o. [id number] Married Caucasian female here for annual exam.    Is hot natured in general.   Started taking Zoloft  for depression, and noticed this increased her heat.  FSH 61.9 and estradiol  34 on 06/09/23.   Had thyroglossal duct cyst removed.    Wants Bactrim  DS on hand for UTIs which she tends to get on weekends or vacation time.   PCP: Copland, Harlene BROCKS, MD   No LMP recorded. (Menstrual status: IUD).         Sexually active: Yes.    The current method of family planning is IUD Mirena  inserted 06/16/22.    Menopausal hormone therapy:  n/a Exercising: Yes.    Walking Smoker:  no  OB History  Gravida Para Term Preterm AB Living  5 3 2 1 2 3   SAB IAB Ectopic Multiple Live Births  2    3    # Outcome Date GA Lbr Len/2nd Weight Sex Type Anes PTL Lv  5 Preterm 12/2005 [redacted]w[redacted]d  5 lb 8 oz (2.495 kg) F CS-Unspec   LIV  4 Term 12/2003 [redacted]w[redacted]d  10 lb 12 oz (4.876 kg) M CS-Unspec   LIV  3 Term 02/2002 [redacted]w[redacted]d  12 lb 8 oz (5.67 kg) M CS-Unspec   LIV  2 SAB 2002          1 SAB 2001             HEALTH MAINTENANCE: Last 2 paps:  06/09/23 neg HR HPV neg, 01/31/18 neg HPV neg History of abnormal Pap or positive HPV:  no Mammogram:   06/24/23 Breast Density Cat C, BIRADS Cat 1 neg  Colonoscopy:  07/14/18 - due in 2029 Bone Density:  n/a  Result  n/a   Immunization History  Administered Date(s) Administered   Influenza,inj,Quad PF,6+ Mos 07/29/2018   Janssen (J&J) SARS-COV-2 Vaccination 12/06/2019   Tdap 10/18/2013   Zoster Recombinant(Shingrix) 05/27/2022      reports that she has never smoked. She has never been exposed to tobacco smoke. She has never used smokeless tobacco. She reports that she does not drink alcohol and does not use drugs.  Past Medical History:  Diagnosis Date   Abnormal Pap smear of cervix 1996   Cryo   Dysplasia of cervix 1996   cryo, no abn paps since   Hypercholesteremia 2018   Patient denies   Migraine    w/o aura   Placenta accreta    Noted  with last cesarean section 2007    Past Surgical History:  Procedure Laterality Date   CESAREAN SECTION  03, 05, 07   CRYOTHERAPY N/A 1996   cervix   DILATION AND CURETTAGE OF UTERUS     times 2   THYROGLOSSAL DUCT CYST  04/07/2024    Current Outpatient Medications  Medication Sig Dispense Refill   aspirin  EC 81 MG tablet Take 1 tablet (81 mg total) by mouth daily. Swallow whole. 90 tablet 3   Cholecalciferol (VITAMIN D3) 25 MCG (1000 UT) CAPS Take 1 capsule by mouth daily.     EMGALITY 120 MG/ML SOAJ Inject 120 mg into the skin every 30 (thirty) days.     ferrous sulfate ER (SLOW FE) 142 (45 Fe) MG TBCR tablet Take 45 mg by mouth daily.     levonorgestrel  (MIRENA ) 20 MCG/24HR IUD 1 each by Intrauterine route once.     rosuvastatin  (CRESTOR ) 20 MG tablet Take 1 tablet (20 mg total) by mouth daily. 90 tablet 3  sertraline  (ZOLOFT ) 50 MG tablet Take 1 tablet (50 mg total) by mouth daily. Increase to 100 mg after 1-2 weeks if desired 60 tablet 3   SUMAtriptan (IMITREX) 100 MG tablet Take 100 mg by mouth every 2 (two) hours as needed for migraine or headache.     No current facility-administered medications for this visit.    ALLERGIES: Patient has no known allergies.  Family History  Problem Relation Age of Onset   Hypertension Mother    Osteoporosis Mother    Heart attack Father 27       while in [place] , bypass   Diabetes Father    Dementia Father    Hypertension Maternal Grandmother    Heart disease Maternal Grandmother        bypass   Diabetes Maternal Grandfather        questionable   Heart disease Paternal Grandfather    Colon cancer Neg Hx    Esophageal cancer Neg Hx    Rectal cancer Neg Hx    Stomach cancer Neg Hx     Review of Systems  All other systems reviewed and are negative.   PHYSICAL EXAM:  BP 122/84 (BP Location: Left Arm, Patient Position: Sitting)   Pulse 63   Ht 5' 7.75 (1.721 m)   Wt 162 lb (73.5 kg)   SpO2 97%   BMI 24.81 kg/m      General appearance: alert, cooperative and appears stated age Head: normocephalic, without obvious abnormality, atraumatic Neck: no adenopathy, supple, symmetrical, trachea midline and thyroid  normal to inspection and palpation Lungs: clear to auscultation bilaterally Breasts: normal appearance, no masses or tenderness, No nipple retraction or dimpling, No nipple discharge or bleeding, No axillary adenopathy Heart: regular rate and rhythm Abdomen: soft, non-tender; no masses, no organomegaly Extremities: extremities normal, atraumatic, no cyanosis or edema Skin: skin color, texture, turgor normal. No rashes or lesions Lymph nodes: cervical, supraclavicular, and axillary nodes normal. Neurologic: grossly normal  Pelvic: External genitalia:  no lesions              No abnormal inguinal nodes palpated.              Urethra:  normal appearing urethra with no masses, tenderness or lesions              Bartholins and Skenes: normal                 Vagina: normal appearing vagina with normal color and discharge, no lesions              Cervix: no lesions.  IUD strings noted.               Pap taken: no Bimanual Exam:  Uterus:  normal size, contour, position, consistency, mobility, non-tender              Adnexa: no mass, fullness, tenderness              Rectal exam: yes.  Confirms.              Anus:  normal sphincter tone, no lesions  Chaperone was present for exam:  Kari HERO, CMA  ASSESSMENT: Well woman visit with gynecologic exam. Mirena  IUD placed 2023 Menopausal symptoms.  Depression.  PHQ-2-9: 0  PLAN: Mammogram screening discussed. Self breast awareness reviewed. Pap and HRV collected:  no.  Due in 2029. Guidelines for Calcium , Vitamin D , regular exercise program including cardiovascular and weight bearing exercise. Medication refills:  Bactrim   DS po bid x 3 days prn UTI.  We discussed menopausal symptoms and estradiol /progesterone (Mirena ) treatment option.  Benefits of  treating vasomotor symptoms, improving sleep and quality of life, and supporting/improving emotional health reviewed   Potential risks of stroke, DVT, PE, MI, and breast cancer also discussed.   She will start Vivelle  Dot 0.05 mg twice weeky.   Labs with PCP.   FSH and estradiol . Follow up:  in 3 months and also annually.

## 2024-06-18 ENCOUNTER — Ambulatory Visit: Payer: Self-pay | Admitting: Obstetrics and Gynecology

## 2024-07-24 ENCOUNTER — Encounter: Payer: Self-pay | Admitting: Family Medicine

## 2024-07-24 DIAGNOSIS — F4323 Adjustment disorder with mixed anxiety and depressed mood: Secondary | ICD-10-CM

## 2024-07-24 MED ORDER — SERTRALINE HCL 100 MG PO TABS: 150.0000 mg | ORAL_TABLET | Freq: Every day | ORAL | 3 refills | Status: DC

## 2024-08-24 ENCOUNTER — Other Ambulatory Visit: Payer: Self-pay | Admitting: Cardiology

## 2024-09-15 NOTE — Progress Notes (Signed)
 "  GYNECOLOGY  VISIT   HPI: 59 y.o.   Married  Caucasian female   [phone] with No LMP recorded. (Menstrual status: IUD).   here for: 3 month follow up - Vivelle  0.05 mg.   Notes her mood is improved.  Not as hot.  No headaches.      Having muscle and joint aches.    Some hair thinning.  Seeing dermatology.  Having cortisone injections.     GYNECOLOGIC HISTORY: No LMP recorded. (Menstrual status: IUD). Contraception:  IUD Mirena  inserted 06/16/22  Menopausal hormone therapy:  Vivelle  Last 2 paps:   06/09/23 neg HR HPV neg, 01/31/18 neg HPV neg  History of abnormal Pap or positive HPV:  no Mammogram:  06/24/23 Breast Density Cat C, BIRADS Cat 1 neg         OB History     Gravida  5   Para  3   Term  2   Preterm  1   AB  2   Living  3      SAB  2   IAB      Ectopic      Multiple      Live Births  3              Patient Active Problem List   Diagnosis Date Noted   Thyroglossal duct cyst 06/09/2023   Coronary artery disease 25 to 49% stenosis of LAD based on coronary CT angio in 2023 12/05/2021   Atypical chest pain 10/14/2021   Placenta accreta 10/08/2021   Migraine 10/08/2021   Prediabetes 09/18/2021   Vitamin D  deficiency 01/14/2021   Hx of migraine headaches 03/03/2017   Hypercholesteremia 2018   Dysplasia of cervix 1996   Abnormal Pap smear of cervix 1996    Past Medical History:  Diagnosis Date   Abnormal Pap smear of cervix 1996   Cryo   Dysplasia of cervix 1996   cryo, no abn paps since   Hypercholesteremia 2018   Patient denies   Migraine    w/o aura   Placenta accreta    Noted with last cesarean section 2007    Past Surgical History:  Procedure Laterality Date   CESAREAN SECTION  03, 05, 07   CRYOTHERAPY N/A 1996   cervix   DILATION AND CURETTAGE OF UTERUS     times 2   THYROGLOSSAL DUCT CYST  04/07/2024    Current Outpatient Medications  Medication Sig Dispense Refill   aspirin  EC 81 MG tablet Take 1 tablet (81 mg  total) by mouth daily. Swallow whole. 90 tablet 3   Cholecalciferol (VITAMIN D3) 25 MCG (1000 UT) CAPS Take 1 capsule by mouth daily.     EMGALITY 120 MG/ML SOAJ Inject 120 mg into the skin every 30 (thirty) days.     estradiol  (VIVELLE -DOT) 0.075 MG/24HR Place 1 patch onto the skin 2 (two) times a week. 24 patch 2   ferrous sulfate ER (SLOW FE) 142 (45 Fe) MG TBCR tablet Take 45 mg by mouth daily.     levonorgestrel  (MIRENA ) 20 MCG/24HR IUD 1 each by Intrauterine route once.     Minoxidil (ROGAINE MENS EXTRA STRENGTH) 5 % FOAM 0.5 Applications.     naproxen (NAPROSYN) 500 MG tablet Take by mouth.     rosuvastatin  (CRESTOR ) 20 MG tablet Take 1 tablet (20 mg total) by mouth daily. Patient must keep appointment on 10/11/24 for further refills. 1st attempt 48 tablet 0   Sulfacetamide Sodium-Sulfur 10-5 %  LIQD Apply topically.     SUMAtriptan (IMITREX) 100 MG tablet Take 100 mg by mouth every 2 (two) hours as needed for migraine or headache.     No current facility-administered medications for this visit.     ALLERGIES: Patient has no known allergies.  Family History  Problem Relation Age of Onset   Hypertension Mother    Osteoporosis Mother    Heart attack Father 48       while in [place] , bypass   Diabetes Father    Dementia Father    Hypertension Maternal Grandmother    Heart disease Maternal Grandmother        bypass   Diabetes Maternal Grandfather        questionable   Heart disease Paternal Grandfather    Colon cancer Neg Hx    Esophageal cancer Neg Hx    Rectal cancer Neg Hx    Stomach cancer Neg Hx     Social History   Socioeconomic History   Marital status: Married    Spouse name: Not on file   Number of children: Not on file   Years of education: Not on file   Highest education level: Professional school degree (e.g., MD, DDS, DVM, JD)  Occupational History   Not on file  Tobacco Use   Smoking status: Never    Passive exposure: Never   Smokeless tobacco:  Never  Vaping Use   Vaping status: Never Used  Substance and Sexual Activity   Alcohol use: No   Drug use: No   Sexual activity: Yes    Partners: Male    Birth control/protection: Surgical, I.U.D.    Comment: vasectomy/ mirena  inserted 06/16/22  Other Topics Concern   Not on file  Social History Narrative   Not on file   Social Drivers of Health   Tobacco Use: Low Risk (09/18/2024)   Patient History    Smoking Tobacco Use: Never    Smokeless Tobacco Use: Never    Passive Exposure: Never  Financial Resource Strain: Low Risk (02/24/2023)   Overall Financial Resource Strain (CARDIA)    Difficulty of Paying Living Expenses: Not hard at all  Food Insecurity: No Food Insecurity (02/24/2023)   Hunger Vital Sign    Worried About Running Out of Food in the Last Year: Never true    Ran Out of Food in the Last Year: Never true  Transportation Needs: No Transportation Needs (02/24/2023)   PRAPARE - Administrator, Civil Service (Medical): No    Lack of Transportation (Non-Medical): No  Physical Activity: Sufficiently Active (02/24/2023)   Exercise Vital Sign    Days of Exercise per Week: 4 days    Minutes of Exercise per Session: 60 min  Stress: No Stress Concern Present (02/24/2023)   [employer] of Occupational Health - Occupational Stress Questionnaire    Feeling of Stress : Not at all  Social Connections: Socially Integrated (02/24/2023)   Social Connection and Isolation Panel    Frequency of Communication with Friends and Family: More than three times a week    Frequency of Social Gatherings with Friends and Family: Once a week    Attends Religious Services: More than 4 times per year    Active Member of Clubs or Organizations: Yes    Attends Banker Meetings: 1 to 4 times per year    Marital Status: Married  Catering Manager Violence: Not on file  Depression (PHQ2-9): Low Risk (06/15/2024)   Depression (PHQ2-9)  PHQ-2 Score: 0  Recent Concern:  Depression (PHQ2-9) - High Risk (04/24/2024)   Depression (PHQ2-9)    PHQ-2 Score: 11  Alcohol Screen: Not on file  Housing: Low Risk (02/24/2023)   Housing    Last Housing Risk Score: 0  Utilities: Not on file  Health Literacy: Not on file    Review of Systems  Musculoskeletal:  Positive for arthralgias.     PHYSICAL EXAMINATION:   BP 124/82 (BP Location: Left Arm, Patient Position: Sitting)   Pulse 74   SpO2 98%     General appearance: alert, cooperative and appears stated age   ASSESSMENT:  HRT.  Vivelle  Dot and Prometrium.  Joint aches.   PLAN:  Will increase Vivelle  Dot to 0.075 mg twice weekly.  Patient will let me know if she would like to increase further to 0.01 mg twice weekly. #24, RF 2.  She will update her mammogram.   20 min  total time was spent for this patient encounter, including preparation, face-to-face counseling with the patient, coordination of care, and documentation of the encounter.    "

## 2024-09-18 ENCOUNTER — Ambulatory Visit: Admitting: Obstetrics and Gynecology

## 2024-09-18 ENCOUNTER — Encounter: Payer: Self-pay | Admitting: Obstetrics and Gynecology

## 2024-09-18 VITALS — BP 124/82 | HR 74

## 2024-09-18 DIAGNOSIS — Z79890 Hormone replacement therapy (postmenopausal): Secondary | ICD-10-CM | POA: Diagnosis not present

## 2024-09-18 MED ORDER — ESTRADIOL 0.075 MG/24HR TD PTTW: 1.0000 | MEDICATED_PATCH | TRANSDERMAL | 2 refills | Status: AC

## 2024-09-25 ENCOUNTER — Other Ambulatory Visit: Payer: Self-pay | Admitting: Obstetrics and Gynecology

## 2024-09-25 DIAGNOSIS — Z1231 Encounter for screening mammogram for malignant neoplasm of breast: Secondary | ICD-10-CM

## 2024-09-29 ENCOUNTER — Ambulatory Visit
Admission: RE | Admit: 2024-09-29 | Discharge: 2024-09-29 | Disposition: A | Discharge status: Home / Self Care | Source: Ambulatory Visit

## 2024-09-29 DIAGNOSIS — Z1231 Encounter for screening mammogram for malignant neoplasm of breast: Secondary | ICD-10-CM

## 2024-10-04 ENCOUNTER — Ambulatory Visit: Payer: Self-pay | Admitting: Obstetrics and Gynecology

## 2024-10-11 ENCOUNTER — Ambulatory Visit: Admitting: Cardiology

## 2024-10-11 ENCOUNTER — Other Ambulatory Visit: Payer: Self-pay | Admitting: Cardiology

## 2024-10-11 ENCOUNTER — Encounter: Payer: Self-pay | Admitting: Cardiology

## 2024-10-11 VITALS — BP 124/82 | HR 68 | Ht 67.75 in | Wt 180.0 lb

## 2024-10-11 DIAGNOSIS — R5383 Other fatigue: Secondary | ICD-10-CM

## 2024-10-11 DIAGNOSIS — E7800 Pure hypercholesterolemia, unspecified: Secondary | ICD-10-CM | POA: Diagnosis not present

## 2024-10-11 DIAGNOSIS — R0789 Other chest pain: Secondary | ICD-10-CM

## 2024-10-11 DIAGNOSIS — I2510 Atherosclerotic heart disease of native coronary artery without angina pectoris: Secondary | ICD-10-CM

## 2024-10-11 NOTE — Progress Notes (Signed)
 " Cardiology Office Note:    Date:  10/11/2024   ID:  [name], DOB 04/09/1966, MRN [id number]  PCP:  Watt Harlene JAYSON, MD  Cardiologist:  Lamar Fitch, MD    Referring MD: Watt Harlene JAYSON, MD   Chief Complaint  Patient presents with   Annual Exam    History of Present Illness:     [name] is a 59 y.o. female past medical history significant for coronary artery disease she did have coronary CT angio in 2023 which showed 25 to 49% stenosis of LAD, also dyslipidemia, borderline diabetes, atypical chest pain.  Comes today to months for follow-up.  Cardiac wise doing well.  Denies have any chest pain tightness squeezing pressure burning chest interestingly she described to have pain in the muscle of her legs she said when she get up it sore.  She is to walk on the regular basis now since the weather is very cold in bed she is basically staying home without an exercise  Past Medical History:  Diagnosis Date   Abnormal Pap smear of cervix 1996   Cryo   Dysplasia of cervix 1996   cryo, no abn paps since   Hypercholesteremia 2018   Patient denies   Migraine    w/o aura   Placenta accreta    Noted with last cesarean section 2007    Past Surgical History:  Procedure Laterality Date   CESAREAN SECTION  03, 05, 07   CRYOTHERAPY N/A 1996   cervix   DILATION AND CURETTAGE OF UTERUS     times 2   THYROGLOSSAL DUCT CYST  04/07/2024    Current Medications: Active Medications[1]   Allergies:   Patient has no known allergies.   Social History   Socioeconomic History   Marital status: Married    Spouse name: Not on file   Number of children: Not on file   Years of education: Not on file   Highest education level: Professional school degree (e.g., MD, DDS, DVM, JD)  Occupational History   Not on file  Tobacco Use   Smoking status: Never    Passive exposure: Never   Smokeless tobacco: Never  Vaping Use   Vaping status: Never Used  Substance and Sexual  Activity   Alcohol use: No   Drug use: No   Sexual activity: Yes    Partners: Male    Birth control/protection: Surgical, I.U.D.    Comment: vasectomy/ mirena  inserted 06/16/22  Other Topics Concern   Not on file  Social History Narrative   Not on file   Social Drivers of Health   Tobacco Use: Low Risk (10/11/2024)   Patient History    Smoking Tobacco Use: Never    Smokeless Tobacco Use: Never    Passive Exposure: Never  Financial Resource Strain: Low Risk (02/24/2023)   Overall Financial Resource Strain (CARDIA)    Difficulty of Paying Living Expenses: Not hard at all  Food Insecurity: No Food Insecurity (02/24/2023)   Hunger Vital Sign    Worried About Running Out of Food in the Last Year: Never true    Ran Out of Food in the Last Year: Never true  Transportation Needs: No Transportation Needs (02/24/2023)   PRAPARE - Administrator, Civil Service (Medical): No    Lack of Transportation (Non-Medical): No  Physical Activity: Sufficiently Active (02/24/2023)   Exercise Vital Sign    Days of Exercise per Week: 4 days    Minutes of Exercise  per Session: 60 min  Stress: No Stress Concern Present (02/24/2023)   [employer] of Occupational Health - Occupational Stress Questionnaire    Feeling of Stress : Not at all  Social Connections: Socially Integrated (02/24/2023)   Social Connection and Isolation Panel    Frequency of Communication with Friends and Family: More than three times a week    Frequency of Social Gatherings with Friends and Family: Once a week    Attends Religious Services: More than 4 times per year    Active Member of Clubs or Organizations: Yes    Attends Banker Meetings: 1 to 4 times per year    Marital Status: Married  Depression (PHQ2-9): Low Risk (06/15/2024)   Depression (PHQ2-9)    PHQ-2 Score: 0  Recent Concern: Depression (PHQ2-9) - High Risk (04/24/2024)   Depression (PHQ2-9)    PHQ-2 Score: 11  Alcohol Screen: Not on file   Housing: Low Risk (02/24/2023)   Housing    Last Housing Risk Score: 0  Utilities: Not on file  Health Literacy: Not on file     Family History: The patient's family history includes Dementia in her father; Diabetes in her father and maternal grandfather; Heart attack (age of onset: 32) in her father; Heart disease in her maternal grandmother and paternal grandfather; Hypertension in her maternal grandmother and mother; Osteoporosis in her mother. There is no history of Colon cancer, Esophageal cancer, Rectal cancer, or Stomach cancer. ROS:   Please see the history of present illness.    All 14 point review of systems negative except as described per history of present illness  EKGs/Labs/Other Studies Reviewed:         Recent Labs: No results found for requested labs within last 365 days.  Recent Lipid Panel    Component Value Date/Time   CHOL 136 02/25/2023 1103   CHOL 131 05/13/2022 1123   TRIG 54.0 02/25/2023 1103   HDL 54.10 02/25/2023 1103   HDL 49 05/13/2022 1123   CHOLHDL 3 02/25/2023 1103   VLDL 10.8 02/25/2023 1103   LDLCALC 71 02/25/2023 1103   LDLCALC 70 05/13/2022 1123    Physical Exam:    VS:  BP 124/82   Pulse 68   Ht 5' 7.75 (1.721 m)   Wt 180 lb (81.6 kg)   SpO2 98%   BMI 27.57 kg/m     Wt Readings from Last 3 Encounters:  10/11/24 180 lb (81.6 kg)  06/15/24 162 lb (73.5 kg)  05/02/24 160 lb (72.6 kg)     GEN:  Well nourished, well developed in no acute distress HEENT: Normal NECK: No JVD; No carotid bruits LYMPHATICS: No lymphadenopathy CARDIAC: RRR, no murmurs, no rubs, no gallops RESPIRATORY:  Clear to auscultation without rales, wheezing or rhonchi  ABDOMEN: Soft, non-tender, non-distended MUSCULOSKELETAL:  No edema; No deformity  SKIN: Warm and dry LOWER EXTREMITIES: no swelling NEUROLOGIC:  Alert and oriented x 3 PSYCHIATRIC:  Normal affect   ASSESSMENT:    1. Coronary artery disease involving native coronary artery of native  heart without angina pectoris   2. Hypercholesteremia   3. Atypical chest pain    PLAN:    In order of problems listed above:  Coronary to disease only mild based on course [place] decrease risk factors modification she already on antiplatelets therapy which I will continue. Dyslipidemia she is taking Crestor  20 I did review KPN from last numbers from June 2024 with LDL 71 HDL 54.  She complained of  having some muscle aches and more it may be results of this medication, will check CPK also ask if she is willing to stop Crestor  for about 1 week to see if it is get better.  Will also check TSH as well as liver function test. Atypical chest pain denies having any   Medication Adjustments/Labs and Tests Ordered: Current medicines are reviewed at length with the patient today.  Concerns regarding medicines are outlined above.  Orders Placed This Encounter  Procedures   EKG 12-Lead   Medication changes: No orders of the defined types were placed in this encounter.   Signed, Lamar DOROTHA Fitch, MD, [facility] 10/11/2024 10:46 AM    [facility]    [1]  Current Meds  Medication Sig   aspirin  EC 81 MG tablet Take 1 tablet (81 mg total) by mouth daily. Swallow whole.   Cholecalciferol (VITAMIN D3) 25 MCG (1000 UT) CAPS Take 1 capsule by mouth daily.   EMGALITY 120 MG/ML SOAJ Inject 120 mg into the skin every 30 (thirty) days.   estradiol  (VIVELLE -DOT) 0.075 MG/24HR Place 1 patch onto the skin 2 (two) times a week.   ferrous sulfate ER (SLOW FE) 142 (45 Fe) MG TBCR tablet Take 45 mg by mouth daily.   levonorgestrel  (MIRENA ) 20 MCG/24HR IUD 1 each by Intrauterine route once.   Minoxidil (ROGAINE MENS EXTRA STRENGTH) 5 % FOAM 0.5 Applications.   naproxen (NAPROSYN) 500 MG tablet Take by mouth.   rosuvastatin  (CRESTOR ) 20 MG tablet Take 1 tablet (20 mg total) by mouth daily. Patient must keep appointment on 10/11/24 for further refills. 1st attempt   Sulfacetamide  Sodium-Sulfur 10-5 % LIQD Apply topically.   SUMAtriptan (IMITREX) 100 MG tablet Take 100 mg by mouth every 2 (two) hours as needed for migraine or headache.   "

## 2024-10-11 NOTE — Patient Instructions (Signed)
 Medication Instructions:  Your physician recommends that you continue on your current medications as directed. Please refer to the Current Medication list given to you today.  *If you need a refill on your cardiac medications before your next appointment, please call your pharmacy*   Lab Work: CPK, CMP. Lipid, TSH- today [address]rd Floor [address] If you have labs (blood work) drawn today and your tests are completely normal, you will receive your results only by: MyChart Message (if you have MyChart) OR A paper copy in the mail If you have any lab test that is abnormal or we need to change your treatment, we will call you to review the results.   Testing/Procedures: None Ordered   Follow-Up: At [facility], you and your health needs are our priority.  As part of our continuing mission to provide you with exceptional heart care, we have created designated Provider Care Teams.  These Care Teams include your primary Cardiologist (physician) and Advanced Practice Providers (APPs -  Physician Assistants and Nurse Practitioners) who all work together to provide you with the care you need, when you need it.  We recommend signing up for the patient portal called MyChart.  Sign up information is provided on this After Visit Summary.  MyChart is used to connect with patients for Virtual Visits (Telemedicine).  Patients are able to view lab/test results, encounter notes, upcoming appointments, etc.  Non-urgent messages can be sent to your provider as well.   To learn more about what you can do with MyChart, go to [email or web address].    Your next appointment:   12 month(s)  The format for your next appointment:   In Person  Provider:   Lamar Fitch, MD    Other Instructions NA

## 2024-10-12 ENCOUNTER — Telehealth: Payer: Self-pay | Admitting: Cardiology

## 2024-10-12 LAB — LIPID PANEL
Chol/HDL Ratio: 2.4 ratio (ref 0.0–4.4)
Cholesterol, Total: 163 mg/dL (ref 100–199)
HDL: 67 mg/dL
LDL Chol Calc (NIH): 81 mg/dL (ref 0–99)
Triglycerides: 82 mg/dL (ref 0–149)
VLDL Cholesterol Cal: 15 mg/dL (ref 5–40)

## 2024-10-12 LAB — COMPREHENSIVE METABOLIC PANEL WITH GFR
ALT: 35 [IU]/L — ABNORMAL HIGH (ref 0–32)
AST: 26 [IU]/L (ref 0–40)
Albumin: 4.7 g/dL (ref 3.8–4.9)
Alkaline Phosphatase: 67 [IU]/L (ref 49–135)
BUN/Creatinine Ratio: 15 (ref 9–23)
BUN: 10 mg/dL (ref 6–24)
Bilirubin Total: 0.6 mg/dL (ref 0.0–1.2)
CO2: 22 mmol/L (ref 20–29)
Calcium: 9.4 mg/dL (ref 8.7–10.2)
Chloride: 101 mmol/L (ref 96–106)
Creatinine, Ser: 0.66 mg/dL (ref 0.57–1.00)
Globulin, Total: 2 g/dL (ref 1.5–4.5)
Glucose: 93 mg/dL (ref 70–99)
Potassium: 4.6 mmol/L (ref 3.5–5.2)
Sodium: 140 mmol/L (ref 134–144)
Total Protein: 6.7 g/dL (ref 6.0–8.5)
eGFR: 103 mL/min/{1.73_m2}

## 2024-10-12 LAB — TSH: TSH: 1.02 u[IU]/mL (ref 0.450–4.500)

## 2024-10-12 NOTE — Telephone Encounter (Signed)
 Patient is calling in to see why the CK lab wasn't taken on yesterday. Please advise

## 2024-10-12 NOTE — Telephone Encounter (Signed)
 Called the patient and informed her that Dr. Bernie would like her to have the lab drawn. Patient verbalized understanding and had no further questions at this time.

## 2024-10-12 NOTE — Telephone Encounter (Signed)
 Called the patient and she was asking why the CPK lab was not completed when that was one of the main reasons why she had the appointment with Dr. Bernie. Spoke with Dr. Krasowski to see if she still needed to have the lab drawn, since it was not drawn yesterday. Dr. Bernie stated that the lab needed to be drawn. Attempted to call the patient to inform her of Dr. Karry recommendation and she did not answer the phone. A message was left for the patient to call back.

## 2024-10-12 NOTE — Telephone Encounter (Signed)
 Patient is returning call

## 2024-10-14 LAB — CK: Total CK: 60 U/L (ref 32–182)

## 2024-10-16 ENCOUNTER — Ambulatory Visit: Payer: Self-pay | Admitting: Cardiology

## 2024-10-16 DIAGNOSIS — E7800 Pure hypercholesterolemia, unspecified: Secondary | ICD-10-CM

## 2024-10-16 MED ORDER — ROSUVASTATIN CALCIUM 40 MG PO TABS: 40.0000 mg | ORAL_TABLET | Freq: Every day | ORAL | 1 refills | Status: AC

## 2024-10-16 MED ORDER — ROSUVASTATIN CALCIUM 40 MG PO TABS: 40.0000 mg | ORAL_TABLET | Freq: Every day | ORAL | 1 refills | Status: DC

## 2025-06-25 ENCOUNTER — Ambulatory Visit: Admitting: Obstetrics and Gynecology
